# Patient Record
Sex: Female | Born: 1995 | Race: Black or African American | Hispanic: No | Marital: Single | Smoking: Never smoker
Health system: Southern US, Community
[De-identification: ages and names within clinical notes are randomized; demographics above are authoritative.]

## PROBLEM LIST (undated history)

## (undated) ENCOUNTER — Inpatient Hospital Stay (HOSPITAL_COMMUNITY): Payer: Self-pay

## (undated) DIAGNOSIS — T4145XA Adverse effect of unspecified anesthetic, initial encounter: Secondary | ICD-10-CM

## (undated) DIAGNOSIS — J45909 Unspecified asthma, uncomplicated: Secondary | ICD-10-CM

## (undated) DIAGNOSIS — T8859XA Other complications of anesthesia, initial encounter: Secondary | ICD-10-CM

## (undated) HISTORY — PX: INDUCED ABORTION: SHX677

---

## 2017-02-28 ENCOUNTER — Inpatient Hospital Stay (HOSPITAL_COMMUNITY)
Admission: AD | Admit: 2017-02-28 | Discharge: 2017-02-28 | Disposition: A | Discharge status: Home / Self Care | Payer: Self-pay | Source: Ambulatory Visit | Attending: Obstetrics and Gynecology | Admitting: Obstetrics and Gynecology

## 2017-02-28 ENCOUNTER — Inpatient Hospital Stay (HOSPITAL_COMMUNITY): Payer: Self-pay

## 2017-02-28 ENCOUNTER — Encounter (HOSPITAL_COMMUNITY): Payer: Self-pay | Admitting: *Deleted

## 2017-02-28 DIAGNOSIS — O3680X0 Pregnancy with inconclusive fetal viability, not applicable or unspecified: Secondary | ICD-10-CM

## 2017-02-28 DIAGNOSIS — N83202 Unspecified ovarian cyst, left side: Secondary | ICD-10-CM | POA: Insufficient documentation

## 2017-02-28 DIAGNOSIS — O3481 Maternal care for other abnormalities of pelvic organs, first trimester: Secondary | ICD-10-CM | POA: Insufficient documentation

## 2017-02-28 DIAGNOSIS — O26899 Other specified pregnancy related conditions, unspecified trimester: Secondary | ICD-10-CM

## 2017-02-28 DIAGNOSIS — N760 Acute vaginitis: Secondary | ICD-10-CM

## 2017-02-28 DIAGNOSIS — O9989 Other specified diseases and conditions complicating pregnancy, childbirth and the puerperium: Secondary | ICD-10-CM

## 2017-02-28 DIAGNOSIS — R109 Unspecified abdominal pain: Secondary | ICD-10-CM

## 2017-02-28 DIAGNOSIS — B379 Candidiasis, unspecified: Secondary | ICD-10-CM

## 2017-02-28 DIAGNOSIS — O98811 Other maternal infectious and parasitic diseases complicating pregnancy, first trimester: Secondary | ICD-10-CM | POA: Insufficient documentation

## 2017-02-28 DIAGNOSIS — Z3A01 Less than 8 weeks gestation of pregnancy: Secondary | ICD-10-CM | POA: Insufficient documentation

## 2017-02-28 DIAGNOSIS — B373 Candidiasis of vulva and vagina: Secondary | ICD-10-CM | POA: Insufficient documentation

## 2017-02-28 DIAGNOSIS — O23591 Infection of other part of genital tract in pregnancy, first trimester: Secondary | ICD-10-CM | POA: Insufficient documentation

## 2017-02-28 DIAGNOSIS — B9689 Other specified bacterial agents as the cause of diseases classified elsewhere: Secondary | ICD-10-CM

## 2017-02-28 HISTORY — DX: Unspecified asthma, uncomplicated: J45.909

## 2017-02-28 LAB — WET PREP, GENITAL
SPERM: NONE SEEN
Trich, Wet Prep: NONE SEEN

## 2017-02-28 LAB — CBC
HCT: 37.5 % (ref 36.0–46.0)
HEMOGLOBIN: 12.7 g/dL (ref 12.0–15.0)
MCH: 30.5 pg (ref 26.0–34.0)
MCHC: 33.9 g/dL (ref 30.0–36.0)
MCV: 89.9 fL (ref 78.0–100.0)
Platelets: 202 10*3/uL (ref 150–400)
RBC: 4.17 MIL/uL (ref 3.87–5.11)
RDW: 13.2 % (ref 11.5–15.5)
WBC: 8.6 10*3/uL (ref 4.0–10.5)

## 2017-02-28 LAB — URINALYSIS, ROUTINE W REFLEX MICROSCOPIC
BACTERIA UA: NONE SEEN
BILIRUBIN URINE: NEGATIVE
GLUCOSE, UA: NEGATIVE mg/dL
Ketones, ur: NEGATIVE mg/dL
LEUKOCYTES UA: NEGATIVE
NITRITE: NEGATIVE
Protein, ur: NEGATIVE mg/dL
SPECIFIC GRAVITY, URINE: 1.027 (ref 1.005–1.030)
pH: 5 (ref 5.0–8.0)

## 2017-02-28 LAB — POCT PREGNANCY, URINE: Preg Test, Ur: POSITIVE — AB

## 2017-02-28 LAB — HCG, QUANTITATIVE, PREGNANCY: hCG, Beta Chain, Quant, S: 795 m[IU]/mL — ABNORMAL HIGH (ref <5)

## 2017-02-28 MED ORDER — CLINDAMYCIN PHOSPHATE 2 % VA CREA: 1.0000 | TOPICAL_CREAM | Freq: Every day | VAGINAL | 0 refills | Status: DC

## 2017-02-28 MED ORDER — TERCONAZOLE 0.4 % VA CREA: 1.0000 | TOPICAL_CREAM | Freq: Every day | VAGINAL | 0 refills | Status: DC

## 2017-02-28 MED ORDER — METRONIDAZOLE 500 MG PO TABS: 500.0000 mg | ORAL_TABLET | Freq: Two times a day (BID) | ORAL | 0 refills | Status: DC

## 2017-02-28 NOTE — MAU Provider Note (Signed)
History   [id number]   Chief Complaint  Patient presents with  . Abdominal Pain    HPI Carla Hammond is a 21 y.o. female here at approximately 4.5 wks pregnancy by certain LMP.  Reports mid-pelvic pain that started around 1600 today.  Pain is described as sharp.  Denies vaginal bleeding or abnormal vaginal discharge.  No report of UTI symptoms.  Symptoms not associated with nausea, vomiting, diarrhea, or constipation.  No LMP recorded.  OB History  No data available    Past Medical History:  Diagnosis Date  . Asthma     No family history on file.  Social History   Social History  . Marital status: Single    Spouse name: N/A  . Number of children: N/A  . Years of education: N/A   Social History Main Topics  . Smoking status: None  . Smokeless tobacco: None  . Alcohol use None  . Drug use: Unknown  . Sexual activity: Not Asked   Other Topics Concern  . None   Social History Narrative  . None    No Known Allergies  No current facility-administered medications on file prior to encounter.    No current outpatient prescriptions on file prior to encounter.     Review of Systems  Constitutional: Negative for chills and fever.  Gastrointestinal: Negative for abdominal pain, blood in stool, constipation, diarrhea, nausea and vomiting.  Genitourinary: Positive for pelvic pain. Negative for dysuria, flank pain, menstrual problem, vaginal bleeding and vaginal discharge.  All other systems reviewed and are negative.    Physical Exam   Vitals:   02/28/17 2133 02/28/17 2139 02/28/17 2141  BP: (!) 154/76 (!) 150/72 136/76  Pulse: 90 62 70  Resp: 18    Temp: 98.1 F (36.7 C)    TempSrc: Oral    Cuff changed to appropriate size  Physical Exam  Constitutional: She is oriented to person, place, and time. She appears well-developed and well-nourished. No distress.  HENT:  Head: Normocephalic.  Neck: Normal range of motion. Neck supple.  Cardiovascular:  Normal rate, regular rhythm and normal heart sounds.   Respiratory: Effort normal and breath sounds normal. No respiratory distress.  GI: Soft. She exhibits no mass. There is no tenderness. There is no rebound and no guarding.  Genitourinary: Uterus is enlarged. Right adnexum displays no mass, no tenderness and no fullness. Left adnexum displays no mass, no tenderness and no fullness. No bleeding in the vagina.  Musculoskeletal: Normal range of motion.  Neurological: She is alert and oriented to person, place, and time.  Skin: Skin is warm and dry.    MAU Course  Procedures  MDM Results for orders placed or performed during the hospital encounter of 02/28/17 (from the past 24 hour(s))  Urinalysis, Routine w reflex microscopic     Status: Abnormal   Collection Time: 02/28/17  9:10 PM  Result Value Ref Range   Color, Urine YELLOW YELLOW   APPearance HAZY (A) CLEAR   Specific Gravity, Urine 1.027 1.005 - 1.030   pH 5.0 5.0 - 8.0   Glucose, UA NEGATIVE NEGATIVE mg/dL   Hgb urine dipstick SMALL (A) NEGATIVE   Bilirubin Urine NEGATIVE NEGATIVE   Ketones, ur NEGATIVE NEGATIVE mg/dL   Protein, ur NEGATIVE NEGATIVE mg/dL   Nitrite NEGATIVE NEGATIVE   Leukocytes, UA NEGATIVE NEGATIVE   RBC / HPF 0-5 0 - 5 RBC/hpf   WBC, UA 0-5 0 - 5 WBC/hpf   Bacteria, UA NONE SEEN NONE SEEN  Squamous Epithelial / LPF 0-5 (A) NONE SEEN   Mucous PRESENT   Pregnancy, urine POC     Status: Abnormal   Collection Time: 02/28/17  9:34 PM  Result Value Ref Range   Preg Test, Ur POSITIVE (A) NEGATIVE  hCG, quantitative, pregnancy     Status: Abnormal   Collection Time: 02/28/17 10:01 PM  Result Value Ref Range   hCG, Beta Chain, Quant, S 795 (H) <5 mIU/mL  CBC     Status: None   Collection Time: 02/28/17 10:01 PM  Result Value Ref Range   WBC 8.6 4.0 - 10.5 K/uL   RBC 4.17 3.87 - 5.11 MIL/uL   Hemoglobin 12.7 12.0 - 15.0 g/dL   HCT 40.9 81.1 - 91.4 %   MCV 89.9 78.0 - 100.0 fL   MCH 30.5 26.0 - 34.0  pg   MCHC 33.9 30.0 - 36.0 g/dL   RDW 78.2 95.6 - 21.3 %   Platelets 202 150 - 400 K/uL  Wet prep, genital     Status: Abnormal   Collection Time: 02/28/17 10:45 PM  Result Value Ref Range   Yeast Wet Prep HPF POC PRESENT (A) NONE SEEN   Trich, Wet Prep NONE SEEN NONE SEEN   Clue Cells Wet Prep HPF POC PRESENT (A) NONE SEEN   WBC, Wet Prep HPF POC FEW (A) NONE SEEN   Sperm NONE SEEN    Ultrasound: FINDINGS: Intrauterine gestational sac: 3 mm anechoic structure within the endometrium.  Yolk sac:  Not present  Embryo:  Not present  Cardiac Activity: Not present  MSD: 3  mm   5 w   0  d  Subchorionic hemorrhage:  None visualized.  Maternal uterus/adnexae: Small to moderate amount of free fluid. Complex LEFT adnexal hypoechoic 5 x 4.2 x 5 cm structure with lace like echogenicity, increased through transmission, no solid or vascular component.  IMPRESSION: 1. Probable early intrauterine gestational sac, but no yolk sac, fetal pole, or cardiac activity yet visualized. Recommend follow-up quantitative B-HCG levels and follow-up US in 14 days to assess viability. This recommendation follows SRU consensus guidelines: Diagnostic Criteria for Nonviable Pregnancy Early in the First Trimester. Malva Limes Med 2013; 086:5784-69. 2. LEFT adnexal 5 cm complex cyst most consistent with hemorrhagic cyst ; given presence of small to moderate amount of free fluid, this has likely ruptured.  Assessment and Plan  Pelvic Pain in Pregnancy - r/o ectopic Left Ovarian Cyst Bacterial Vaginosis Yeast Infection  Plan: Discharge home Follow-up [facility] in [place] (going back tomorrow) RX Clindamycin Cream RX Monistat OTC Reviewed ectopic precautions and importance of follow-up  Talmage Nap 02/28/2017 11:31 PM

## 2017-02-28 NOTE — MAU Note (Signed)
Lower abdominal pain 1800, denies bleeding

## 2017-02-28 NOTE — Discharge Instructions (Signed)
[facility] 795 02/28/17

## 2017-03-01 LAB — RPR: RPR Ser Ql: NONREACTIVE

## 2017-03-01 LAB — HIV ANTIBODY (ROUTINE TESTING W REFLEX): HIV SCREEN 4TH GENERATION: NONREACTIVE

## 2017-03-02 LAB — GC/CHLAMYDIA PROBE AMP (~~LOC~~) NOT AT ARMC
Chlamydia: NEGATIVE
Neisseria Gonorrhea: NEGATIVE

## 2017-12-19 ENCOUNTER — Encounter (HOSPITAL_COMMUNITY): Payer: Self-pay | Admitting: *Deleted

## 2017-12-19 ENCOUNTER — Inpatient Hospital Stay (HOSPITAL_COMMUNITY)
Admission: AD | Admit: 2017-12-19 | Discharge: 2017-12-20 | Disposition: A | Discharge status: Home / Self Care | Payer: Medicaid Other | Source: Ambulatory Visit | Attending: Obstetrics & Gynecology | Admitting: Obstetrics & Gynecology

## 2017-12-19 DIAGNOSIS — Z3A01 Less than 8 weeks gestation of pregnancy: Secondary | ICD-10-CM | POA: Insufficient documentation

## 2017-12-19 DIAGNOSIS — R109 Unspecified abdominal pain: Secondary | ICD-10-CM | POA: Diagnosis not present

## 2017-12-19 DIAGNOSIS — O26899 Other specified pregnancy related conditions, unspecified trimester: Secondary | ICD-10-CM

## 2017-12-19 DIAGNOSIS — M545 Low back pain: Secondary | ICD-10-CM | POA: Insufficient documentation

## 2017-12-19 DIAGNOSIS — O26891 Other specified pregnancy related conditions, first trimester: Secondary | ICD-10-CM | POA: Diagnosis not present

## 2017-12-19 DIAGNOSIS — O283 Abnormal ultrasonic finding on antenatal screening of mother: Secondary | ICD-10-CM | POA: Diagnosis not present

## 2017-12-19 DIAGNOSIS — O3680X0 Pregnancy with inconclusive fetal viability, not applicable or unspecified: Secondary | ICD-10-CM

## 2017-12-19 LAB — URINALYSIS, ROUTINE W REFLEX MICROSCOPIC
BILIRUBIN URINE: NEGATIVE
Glucose, UA: NEGATIVE mg/dL
Hgb urine dipstick: NEGATIVE
KETONES UR: NEGATIVE mg/dL
Leukocytes, UA: NEGATIVE
Nitrite: NEGATIVE
PROTEIN: NEGATIVE mg/dL
Specific Gravity, Urine: 1.024 (ref 1.005–1.030)
pH: 6 (ref 5.0–8.0)

## 2017-12-19 LAB — POCT PREGNANCY, URINE: PREG TEST UR: POSITIVE — AB

## 2017-12-19 NOTE — MAU Note (Addendum)
Having some pain in lower back and R side since last night. Constipated and had small BM this am. Denies vag bleeding or d/c. LMP 11/16/17. Pos home upt

## 2017-12-20 ENCOUNTER — Encounter (HOSPITAL_COMMUNITY): Payer: Self-pay | Admitting: *Deleted

## 2017-12-20 ENCOUNTER — Inpatient Hospital Stay (HOSPITAL_COMMUNITY): Payer: Medicaid Other

## 2017-12-20 DIAGNOSIS — O283 Abnormal ultrasonic finding on antenatal screening of mother: Secondary | ICD-10-CM

## 2017-12-20 LAB — CBC
HEMATOCRIT: 39.9 % (ref 36.0–46.0)
HEMOGLOBIN: 13.8 g/dL (ref 12.0–15.0)
MCH: 31.5 pg (ref 26.0–34.0)
MCHC: 34.6 g/dL (ref 30.0–36.0)
MCV: 91.1 fL (ref 78.0–100.0)
Platelets: 250 10*3/uL (ref 150–400)
RBC: 4.38 MIL/uL (ref 3.87–5.11)
RDW: 13.1 % (ref 11.5–15.5)
WBC: 9.3 10*3/uL (ref 4.0–10.5)

## 2017-12-20 LAB — WET PREP, GENITAL
Sperm: NONE SEEN
Trich, Wet Prep: NONE SEEN
YEAST WET PREP: NONE SEEN

## 2017-12-20 LAB — HCG, QUANTITATIVE, PREGNANCY: hCG, Beta Chain, Quant, S: 295 m[IU]/mL — ABNORMAL HIGH (ref <5)

## 2017-12-20 LAB — ABO/RH: ABO/RH(D): B POS

## 2017-12-20 NOTE — Progress Notes (Signed)
Written and verbal d/c instructions given and understanding voiced. 

## 2017-12-20 NOTE — Discharge Instructions (Signed)
First Trimester of Pregnancy °The first trimester of pregnancy is from week 1 until the end of week 13 (months 1 through 3). A week after a sperm fertilizes an egg, the egg will implant on the wall of the uterus. This embryo will begin to develop into a baby. Genes from you and your partner will form the baby. The female genes will determine whether the baby will be a boy or a girl. At 6-8 weeks, the eyes and face will be formed, and the heartbeat can be seen on ultrasound. At the end of 12 weeks, all the baby's organs will be formed. °Now that you are pregnant, you will want to do everything you can to have a healthy baby. Two of the most important things are to get good prenatal care and to follow your health care provider's instructions. Prenatal care is all the medical care you receive before the baby's birth. This care will help prevent, find, and treat any problems during the pregnancy and childbirth. °Body changes during your first trimester °Your body goes through many changes during pregnancy. The changes vary from woman to woman. °· You may gain or lose a couple of pounds at first. °· You may feel sick to your stomach (nauseous) and you may throw up (vomit). If the vomiting is uncontrollable, call your health care provider. °· You may tire easily. °· You may develop headaches that can be relieved by medicines. All medicines should be approved by your health care provider. °· You may urinate more often. Painful urination may mean you have a bladder infection. °· You may develop heartburn as a result of your pregnancy. °· You may develop constipation because certain hormones are causing the muscles that push stool through your intestines to slow down. °· You may develop hemorrhoids or swollen veins (varicose veins). °· Your breasts may begin to grow larger and become tender. Your nipples may stick out more, and the tissue that surrounds them (areola) may become darker. °· Your gums may bleed and may be  sensitive to brushing and flossing. °· Dark spots or blotches (chloasma, mask of pregnancy) may develop on your face. This will likely fade after the baby is born. °· Your menstrual periods will stop. °· You may have a loss of appetite. °· You may develop cravings for certain kinds of food. °· You may have changes in your emotions from day to day, such as being excited to be pregnant or being concerned that something may go wrong with the pregnancy and baby. °· You may have more vivid and strange dreams. °· You may have changes in your hair. These can include thickening of your hair, rapid growth, and changes in texture. Some women also have hair loss during or after pregnancy, or hair that feels dry or thin. Your hair will most likely return to normal after your baby is born. ° °What to expect at prenatal visits °During a routine prenatal visit: °· You will be weighed to make sure you and the baby are growing normally. °· Your blood pressure will be taken. °· Your abdomen will be measured to track your baby's growth. °· The fetal heartbeat will be listened to between weeks 10 and 14 of your pregnancy. °· Test results from any previous visits will be discussed. ° °Your health care provider may ask you: °· How you are feeling. °· If you are feeling the baby move. °· If you have had any abnormal symptoms, such as leaking fluid, bleeding, severe headaches,   or abdominal cramping. °· If you are using any tobacco products, including cigarettes, chewing tobacco, and electronic cigarettes. °· If you have any questions. ° °Other tests that may be performed during your first trimester include: °· Blood tests to find your blood type and to check for the presence of any previous infections. The tests will also be used to check for low iron levels (anemia) and protein on red blood cells (Rh antibodies). Depending on your risk factors, or if you previously had diabetes during pregnancy, you may have tests to check for high blood  sugar that affects pregnant women (gestational diabetes). °· Urine tests to check for infections, diabetes, or protein in the urine. °· An ultrasound to confirm the proper growth and development of the baby. °· Fetal screens for spinal cord problems (spina bifida) and Down syndrome. °· HIV (human immunodeficiency virus) testing. Routine prenatal testing includes screening for HIV, unless you choose not to have this test. °· You may need other tests to make sure you and the baby are doing well. ° °Follow these instructions at home: °Medicines °· Follow your health care provider's instructions regarding medicine use. Specific medicines may be either safe or unsafe to take during pregnancy. °· Take a prenatal vitamin that contains at least 600 micrograms (mcg) of folic acid. °· If you develop constipation, try taking a stool softener if your health care provider approves. °Eating and drinking °· Eat a balanced diet that includes fresh fruits and vegetables, whole grains, good sources of protein such as meat, eggs, or tofu, and low-fat dairy. Your health care provider will help you determine the amount of weight gain that is right for you. °· Avoid raw meat and uncooked cheese. These carry germs that can cause birth defects in the baby. °· Eating four or five small meals rather than three large meals a day may help relieve nausea and vomiting. If you start to feel nauseous, eating a few soda crackers can be helpful. Drinking liquids between meals, instead of during meals, also seems to help ease nausea and vomiting. °· Limit foods that are high in fat and processed sugars, such as fried and sweet foods. °· To prevent constipation: °? Eat foods that are high in fiber, such as fresh fruits and vegetables, whole grains, and beans. °? Drink enough fluid to keep your urine clear or pale yellow. °Activity °· Exercise only as directed by your health care provider. Most women can continue their usual exercise routine during  pregnancy. Try to exercise for 30 minutes at least 5 days a week. Exercising will help you: °? Control your weight. °? Stay in shape. °? Be prepared for labor and delivery. °· Experiencing pain or cramping in the lower abdomen or lower back is a good sign that you should stop exercising. Check with your health care provider before continuing with normal exercises. °· Try to avoid standing for long periods of time. Move your legs often if you must stand in one place for a long time. °· Avoid heavy lifting. °· Wear low-heeled shoes and practice good posture. °· You may continue to have sex unless your health care provider tells you not to. °Relieving pain and discomfort °· Wear a good support bra to relieve breast tenderness. °· Take warm sitz baths to soothe any pain or discomfort caused by hemorrhoids. Use hemorrhoid cream if your health care provider approves. °· Rest with your legs elevated if you have leg cramps or low back pain. °· If you develop   varicose veins in your legs, wear support hose. Elevate your feet for 15 minutes, 3-4 times a day. Limit salt in your diet. °Prenatal care °· Schedule your prenatal visits by the twelfth week of pregnancy. They are usually scheduled monthly at first, then more often in the last 2 months before delivery. °· Write down your questions. Take them to your prenatal visits. °· Keep all your prenatal visits as told by your health care provider. This is important. °Safety °· Wear your seat belt at all times when driving. °· Make a list of emergency phone numbers, including numbers for family, friends, the hospital, and police and fire departments. °General instructions °· Ask your health care provider for a referral to a local prenatal education class. Begin classes no later than the beginning of month 6 of your pregnancy. °· Ask for help if you have counseling or nutritional needs during pregnancy. Your health care provider can offer advice or refer you to specialists for help  with various needs. °· Do not use hot tubs, steam rooms, or saunas. °· Do not douche or use tampons or scented sanitary pads. °· Do not cross your legs for long periods of time. °· Avoid cat litter boxes and soil used by cats. These carry germs that can cause birth defects in the baby and possibly loss of the fetus by miscarriage or stillbirth. °· Avoid all smoking, herbs, alcohol, and medicines not prescribed by your health care provider. Chemicals in these products affect the formation and growth of the baby. °· Do not use any products that contain nicotine or tobacco, such as cigarettes and e-cigarettes. If you need help quitting, ask your health care provider. You may receive counseling support and other resources to help you quit. °· Schedule a dentist appointment. At home, brush your teeth with a soft toothbrush and be gentle when you floss. °Contact a health care provider if: °· You have dizziness. °· You have mild pelvic cramps, pelvic pressure, or nagging pain in the abdominal area. °· You have persistent nausea, vomiting, or diarrhea. °· You have a bad smelling vaginal discharge. °· You have pain when you urinate. °· You notice increased swelling in your face, hands, legs, or ankles. °· You are exposed to fifth disease or chickenpox. °· You are exposed to German measles (rubella) and have never had it. °Get help right away if: °· You have a fever. °· You are leaking fluid from your vagina. °· You have spotting or bleeding from your vagina. °· You have severe abdominal cramping or pain. °· You have rapid weight gain or loss. °· You vomit blood or material that looks like coffee grounds. °· You develop a severe headache. °· You have shortness of breath. °· You have any kind of trauma, such as from a fall or a car accident. °Summary °· The first trimester of pregnancy is from week 1 until the end of week 13 (months 1 through 3). °· Your body goes through many changes during pregnancy. The changes vary from  woman to woman. °· You will have routine prenatal visits. During those visits, your health care provider will examine you, discuss any test results you may have, and talk with you about how you are feeling. °This information is not intended to replace advice given to you by your health care provider. Make sure you discuss any questions you have with your health care provider. °Document Released: 06/24/2001 Document Revised: 06/11/2016 Document Reviewed: 06/11/2016 °[employer] Interactive Patient Education © 2018 [employer]  Safe Medications in Pregnancy   Acne: Benzoyl Peroxide Salicylic Acid  Backache/Headache: Tylenol: 2 regular strength every 4 hours OR              2 Extra strength every 6 hours  Colds/Coughs/Allergies: Benadryl (alcohol free) 25 mg every 6 hours as needed Breath right strips Claritin Cepacol throat lozenges Chloraseptic throat spray Cold-Eeze- up to three times per day Cough drops, alcohol free Flonase (by prescription only) Guaifenesin Mucinex Robitussin DM (plain only, alcohol free) Saline nasal spray/drops Sudafed (pseudoephedrine) & Actifed ** use only after [redacted] weeks gestation and if you do not have high blood pressure Tylenol Vicks Vaporub Zinc lozenges Zyrtec   Constipation: Colace Ducolax suppositories Fleet enema Glycerin suppositories Metamucil Milk of magnesia Miralax Senokot Smooth move tea  Diarrhea: Kaopectate Imodium A-D  *NO pepto Bismol  Hemorrhoids: Anusol Anusol HC Preparation H Tucks  Indigestion: Tums Maalox Mylanta Zantac  Pepcid  Insomnia: Benadryl (alcohol free) 25mg  every 6 hours as needed Tylenol PM Unisom, no Gelcaps  Leg Cramps: Tums MagGel  Nausea/Vomiting:  Bonine Dramamine Emetrol Ginger extract Sea bands Meclizine  Nausea medication to take during pregnancy:  Unisom (doxylamine succinate 25 mg tablets) Take one tablet daily at bedtime. If symptoms are not adequately controlled, the dose  can be increased to a maximum recommended dose of two tablets daily (1/2 tablet in the morning, 1/2 tablet mid-afternoon and one at bedtime). Vitamin B6 100mg  tablets. Take one tablet twice a day (up to 200 mg per day).  Skin Rashes: Aveeno products Benadryl cream or 25mg  every 6 hours as needed Calamine Lotion 1% cortisone cream  Yeast infection: Gyne-lotrimin 7 Monistat 7   **If taking multiple medications, please check labels to avoid duplicating the same active ingredients **take medication as directed on the label ** Do not exceed 4000 mg of tylenol in 24 hours **Do not take medications that contain aspirin or ibuprofen

## 2017-12-20 NOTE — MAU Provider Note (Addendum)
History     CSN: [id number]  Arrival date and time: 12/19/17 2202  Chief Complaint  Patient presents with  . Constipation  . Back Pain   HPI Carla Hammond is a 22 y.o. G3P0020 at 3728w6d who presents with lower back pain. She states it started yesterday and rates the pain a 4/10. She has not tried anything for the pain. She denies any vaginal bleeding or discharge. She had a positive UPT today. LMP 11/16/17  OB History    Gravida  3   Para      Term      Preterm      AB  2   Living        SAB      TAB  2   Ectopic      Multiple      Live Births              Past Medical History:  Diagnosis Date  . Asthma     Past Surgical History:  Procedure Laterality Date  . INDUCED ABORTION      No family history on file.  Social History   Tobacco Use  . Smoking status: Never Smoker  . Smokeless tobacco: Never Used  Substance Use Topics  . Alcohol use: Not Currently    Comment: occ when not preg  . Drug use: Never    Allergies: No Known Allergies  Medications Prior to Admission  Medication Sig Dispense Refill Last Dose  . albuterol (ACCUNEB) 1.25 MG/3ML nebulizer solution Take 1 ampule by nebulization every 6 (six) hours as needed for wheezing.   Past Week at Unknown time  . ibuprofen (ADVIL,MOTRIN) 200 MG tablet Take 200 mg by mouth every 6 (six) hours as needed.   Past Month at Unknown time  . Multiple Vitamin (MULTIVITAMIN) capsule Take 1 capsule by mouth daily.   12/19/2017 at Unknown time  . naproxen sodium (ALEVE) 220 MG tablet Take 220 mg by mouth.   Past Week at Unknown time  . traMADol (ULTRAM) 50 MG tablet Take by mouth every 6 (six) hours as needed.   Past Month at Unknown time  . metroNIDAZOLE (FLAGYL) 500 MG tablet Take 1 tablet (500 mg total) by mouth 2 (two) times daily. 14 tablet 0   . terconazole (TERAZOL 7) 0.4 % vaginal cream Place 1 applicator vaginally at bedtime. 45 g 0     Review of Systems  Constitutional: Negative.  Negative for  fatigue and fever.  HENT: Negative.   Respiratory: Negative.  Negative for shortness of breath.   Cardiovascular: Negative.  Negative for chest pain.  Gastrointestinal: Positive for abdominal pain. Negative for constipation, diarrhea, nausea and vomiting.  Genitourinary: Negative.  Negative for dysuria, vaginal bleeding and vaginal discharge.  Musculoskeletal: Positive for back pain.  Neurological: Negative.  Negative for dizziness and headaches.   Physical Exam   Blood pressure 128/74, pulse 88, temperature 98.9 F (37.2 C), resp. rate 18, height 5\' 3"  (1.6 m), weight 214 lb (97.1 kg), last menstrual period 11/16/2017, unknown if currently breastfeeding.  Physical Exam  Nursing note and vitals reviewed. Constitutional: She is oriented to person, place, and time. She appears well-developed and well-nourished. No distress.  HENT:  Head: Normocephalic.  Eyes: Pupils are equal, round, and reactive to light.  Cardiovascular: Normal rate, regular rhythm and normal heart sounds.  Respiratory: Effort normal and breath sounds normal. No respiratory distress.  GI: Soft. Bowel sounds are normal. She exhibits no distension. There is  no tenderness.  Neurological: She is alert and oriented to person, place, and time.  Skin: Skin is warm and dry.  Psychiatric: She has a normal mood and affect. Her behavior is normal. Judgment and thought content normal.    MAU Course  Procedures Results for orders placed or performed during the hospital encounter of 12/19/17 (from the past 24 hour(s))  Urinalysis, Routine w reflex microscopic     Status: None   Collection Time: 12/19/17 10:30 PM  Result Value Ref Range   Color, Urine YELLOW YELLOW   APPearance CLEAR CLEAR   Specific Gravity, Urine 1.024 1.005 - 1.030   pH 6.0 5.0 - 8.0   Glucose, UA NEGATIVE NEGATIVE mg/dL   Hgb urine dipstick NEGATIVE NEGATIVE   Bilirubin Urine NEGATIVE NEGATIVE   Ketones, ur NEGATIVE NEGATIVE mg/dL   Protein, ur  NEGATIVE NEGATIVE mg/dL   Nitrite NEGATIVE NEGATIVE   Leukocytes, UA NEGATIVE NEGATIVE  Pregnancy, urine POC     Status: Abnormal   Collection Time: 12/19/17 11:31 PM  Result Value Ref Range   Preg Test, Ur POSITIVE (A) NEGATIVE  Wet prep, genital     Status: Abnormal   Collection Time: 12/19/17 11:32 PM  Result Value Ref Range   Yeast Wet Prep HPF POC NONE SEEN NONE SEEN   Trich, Wet Prep NONE SEEN NONE SEEN   Clue Cells Wet Prep HPF POC PRESENT (A) NONE SEEN   WBC, Wet Prep HPF POC FEW (A) NONE SEEN   Sperm NONE SEEN   CBC     Status: None   Collection Time: 12/20/17 12:25 AM  Result Value Ref Range   WBC 9.3 4.0 - 10.5 K/uL   RBC 4.38 3.87 - 5.11 MIL/uL   Hemoglobin 13.8 12.0 - 15.0 g/dL   HCT 16.1 09.6 - 04.5 %   MCV 91.1 78.0 - 100.0 fL   MCH 31.5 26.0 - 34.0 pg   MCHC 34.6 30.0 - 36.0 g/dL   RDW 40.9 81.1 - 91.4 %   Platelets 250 150 - 400 K/uL  hCG, quantitative, pregnancy     Status: Abnormal   Collection Time: 12/20/17 12:25 AM  Result Value Ref Range   hCG, Beta Chain, Quant, S 295 (H) <5 mIU/mL  ABO/Rh     Status: None (Preliminary result)   Collection Time: 12/20/17 12:25 AM  Result Value Ref Range   ABO/RH(D)      B POS Performed at [facility], [address] [place], [place] [address]    US Ob Less Than 14 Weeks With Ob Transvaginal  Result Date: 12/20/2017 CLINICAL DATA:  Initial evaluation for low back pain with right lower quadrant pain for 1 day. Early pregnancy. EXAM: OBSTETRIC <14 WK [place] AND TRANSVAGINAL OB US TECHNIQUE: Both transabdominal and transvaginal ultrasound examinations were performed for complete evaluation of the gestation as well as the maternal uterus, adnexal regions, and pelvic cul-de-sac. Transvaginal technique was performed to assess early pregnancy. COMPARISON:  None. FINDINGS: Intrauterine gestational sac: Not visualized. Yolk sac:  Absent. Embryo:  Absent. Cardiac Activity: N/A Heart Rate: N/A  bpm Subchorionic hemorrhage:  None  visualized. Maternal uterus/adnexae: Right ovary unremarkable. 2.9 x 2.6 x 2.4 cm probable corpus luteal cyst noted within the left ovary. Small volume free physiologic fluid within the pelvis. IMPRESSION: 1. Early pregnancy with no discrete IUP or adnexal mass identified. Finding is consistent with a pregnancy of unknown anatomic location. Differential considerations include IUP to early to visualize, recent SAB, or possibly occult  ectopic pregnancy. Close clinical monitoring with serial beta HCGs and close interval follow-up ultrasound recommended as clinically warranted. 2. 2.9 cm left ovarian corpus luteal cyst with small volume free physiologic fluid within the pelvis. Electronically Signed   By: Rise Mu M.D.   On: 12/20/2017 02:12   MDM UA, UPT CBC, HCG, ABO/Rh Wet prep and gc/chlamydia US OB Comp Less 14 weeks with Transvaginal   Assessment and Plan   1. Pregnancy of unknown anatomic location   2. Abdominal pain affecting pregnancy   3. [redacted] weeks gestation of pregnancy    -Discharge home in stable condition -Strict ectopic precautions discussed -Patient advised to follow-up with [facility] on Tuesday morning at 0830 for repeat HCG -Patient may return to MAU as needed or if her condition were to change or worsen   Rolm Bookbinder CNM 12/20/2017, 1:48 AM

## 2017-12-21 LAB — GC/CHLAMYDIA PROBE AMP (~~LOC~~) NOT AT ARMC
Chlamydia: NEGATIVE
Neisseria Gonorrhea: NEGATIVE

## 2017-12-22 ENCOUNTER — Other Ambulatory Visit (INDEPENDENT_AMBULATORY_CARE_PROVIDER_SITE_OTHER): Payer: Self-pay | Admitting: General Practice

## 2017-12-22 DIAGNOSIS — O283 Abnormal ultrasonic finding on antenatal screening of mother: Secondary | ICD-10-CM

## 2017-12-22 DIAGNOSIS — O3680X0 Pregnancy with inconclusive fetal viability, not applicable or unspecified: Secondary | ICD-10-CM

## 2017-12-22 LAB — HCG, QUANTITATIVE, PREGNANCY: HCG, BETA CHAIN, QUANT, S: 1048 m[IU]/mL — AB (ref <5)

## 2017-12-22 NOTE — Progress Notes (Signed)
I have reviewed the chart and agree with nursing staff's documentation of this patient's encounter.  Vonzella NippleJulie Wenzel, PA-C 12/22/2017 10:55 AM

## 2017-12-22 NOTE — Progress Notes (Signed)
Patient presents to office today for stat bhcg. Patient denies pain or bleeding. Discussed with patient we are monitoring her bhcg levels today and asked she wait in lobby for results/updated plan of care. Patient verbalized understanding & had no questions at this time.   Reviewed results with Vonzella NippleJulie Wenzel who finds appropriate rise in bhcg levels, patient should have follow up u/s in 7-10 days. Scheduled for 6/19 @ 10am.  Informed patient of results & u/s appt. Ectopic precautions reviewed. Patient verbalized understanding & had no questions.

## 2017-12-30 ENCOUNTER — Ambulatory Visit (INDEPENDENT_AMBULATORY_CARE_PROVIDER_SITE_OTHER): Payer: Self-pay | Admitting: *Deleted

## 2017-12-30 ENCOUNTER — Ambulatory Visit (HOSPITAL_COMMUNITY)
Admission: RE | Admit: 2017-12-30 | Discharge: 2017-12-30 | Disposition: A | Discharge status: Home / Self Care | Payer: Self-pay | Source: Ambulatory Visit | Attending: Medical | Admitting: Medical

## 2017-12-30 DIAGNOSIS — Z3A01 Less than 8 weeks gestation of pregnancy: Secondary | ICD-10-CM | POA: Insufficient documentation

## 2017-12-30 DIAGNOSIS — O368390 Maternal care for abnormalities of the fetal heart rate or rhythm, unspecified trimester, not applicable or unspecified: Secondary | ICD-10-CM

## 2017-12-30 DIAGNOSIS — O3680X0 Pregnancy with inconclusive fetal viability, not applicable or unspecified: Secondary | ICD-10-CM

## 2017-12-30 DIAGNOSIS — Z712 Person consulting for explanation of examination or test findings: Secondary | ICD-10-CM

## 2017-12-30 DIAGNOSIS — Z3490 Encounter for supervision of normal pregnancy, unspecified, unspecified trimester: Secondary | ICD-10-CM

## 2017-12-30 DIAGNOSIS — O283 Abnormal ultrasonic finding on antenatal screening of mother: Secondary | ICD-10-CM | POA: Insufficient documentation

## 2017-12-30 NOTE — Progress Notes (Signed)
US results reviewed by Dr. Marice Potterove. Pt informed of results including low normal fetal heart rate. Pt agrees to repeat US in 2 weeks to evaluate pregnancy progression. Appt scheduled on 7/3 @ 1100.

## 2017-12-31 NOTE — Progress Notes (Signed)
I have reviewed this chart and agree with the RN/CMA assessment and management.    Nelton Amsden C Gwenivere Hiraldo, MD, FACOG Attending Physician, [facility]  

## 2018-01-01 ENCOUNTER — Inpatient Hospital Stay (HOSPITAL_COMMUNITY)
Admission: AD | Admit: 2018-01-01 | Discharge: 2018-01-02 | Disposition: A | Discharge status: Home / Self Care | Payer: Medicaid Other | Source: Ambulatory Visit | Attending: Obstetrics and Gynecology | Admitting: Obstetrics and Gynecology

## 2018-01-01 DIAGNOSIS — O209 Hemorrhage in early pregnancy, unspecified: Secondary | ICD-10-CM | POA: Insufficient documentation

## 2018-01-01 DIAGNOSIS — Z3A01 Less than 8 weeks gestation of pregnancy: Secondary | ICD-10-CM | POA: Insufficient documentation

## 2018-01-01 DIAGNOSIS — J45909 Unspecified asthma, uncomplicated: Secondary | ICD-10-CM | POA: Insufficient documentation

## 2018-01-01 DIAGNOSIS — Z9889 Other specified postprocedural states: Secondary | ICD-10-CM | POA: Insufficient documentation

## 2018-01-01 DIAGNOSIS — Z79899 Other long term (current) drug therapy: Secondary | ICD-10-CM | POA: Insufficient documentation

## 2018-01-01 DIAGNOSIS — O99511 Diseases of the respiratory system complicating pregnancy, first trimester: Secondary | ICD-10-CM | POA: Insufficient documentation

## 2018-01-01 NOTE — MAU Note (Signed)
Had u/s Weds and everything was fine. Went to BR and saw dark red/brown on tissue. No pain.

## 2018-01-01 NOTE — MAU Note (Addendum)
Thressa ShellerHeather Hogan CNM in Triage to see pt. Bedside u/s done and pt reassured. Will d/c home from Triage

## 2018-01-01 NOTE — MAU Provider Note (Addendum)
History     CSN: [id number]  Arrival date and time: 01/01/18 2335   First Provider Initiated Contact with Patient 01/01/18 2355      Chief Complaint  Patient presents with  . Vaginal Bleeding   Carla Hammond is a 22 y.o. G3P0020 at [redacted]w[redacted]d who presents today with brown spotting. She has a confirmed IUP on [place] from 12/30/17. She states that she had one episode of brown spotting just prior to arrival. She denies any pain/   Vaginal Bleeding  The patient's primary symptoms include vaginal bleeding. This is a new problem. The current episode started today. The problem has been resolved. The patient is experiencing no pain. She is pregnant. The vaginal discharge was brown. The vaginal bleeding is spotting. Nothing aggravates the symptoms. She has tried nothing for the symptoms. Sexual activity: No intercourse in the last 24 hours.     Past Medical History:  Diagnosis Date  . Asthma     Past Surgical History:  Procedure Laterality Date  . INDUCED ABORTION      No family history on file.  Social History   Tobacco Use  . Smoking status: Never Smoker  . Smokeless tobacco: Never Used  Substance Use Topics  . Alcohol use: Not Currently    Comment: occ when not preg  . Drug use: Never    Allergies: No Known Allergies  Medications Prior to Admission  Medication Sig Dispense Refill Last Dose  . albuterol (ACCUNEB) 1.25 MG/3ML nebulizer solution Take 1 ampule by nebulization every 6 (six) hours as needed for wheezing.   Past Week at Unknown time  . Multiple Vitamin (MULTIVITAMIN) capsule Take 1 capsule by mouth daily.   12/19/2017 at Unknown time    Review of Systems  Genitourinary: Positive for vaginal bleeding.   Physical Exam   Blood pressure (!) 142/68, pulse 88, temperature 98.6 F (37 C), resp. rate 16, height 5\' 3"  (1.6 m), weight 216 lb (98 kg), last menstrual period 11/16/2017, unknown if currently breastfeeding.  Physical Exam  Nursing note and vitals  reviewed. Constitutional: She is oriented to person, place, and time. She appears well-developed and well-nourished. No distress.  HENT:  Head: Normocephalic.  Cardiovascular: Normal rate.  Respiratory: Effort normal.  GI: Soft. There is no tenderness. There is no rebound.  Neurological: She is alert and oriented to person, place, and time.  Skin: Skin is warm and dry.  Psychiatric: She has a normal mood and affect.    Bedside US done. Fetal pole [redacted]w[redacted]d (consistent prior US)  Pt informed that the ultrasound is considered a limited OB ultrasound and is not intended to be a complete ultrasound exam.  Patient also informed that the ultrasound is not being completed with the intent of assessing for fetal or placental anomalies or any pelvic abnormalities.  Explained that the purpose of today's ultrasound is to assess for  viability.  Patient acknowledges the purpose of the exam and the limitations of the study.      MAU Course  Procedures  MDM   Assessment and Plan   1. Vaginal bleeding in pregnancy, first trimester   2. [redacted] weeks gestation of pregnancy    DC home Comfort measures reviewed  1st Trimester precautions  Bleeding precautions RX: phenergan PRN #30 with 2 RF  Return to MAU as needed FU with OB as planned  Follow-up Information    [facility] Follow up.   Specialty:  Obstetrics and Gynecology Contact information: [address]  [facility] [place] [id number] [phone]           Thressa ShellerHeather Hogan 01/01/2018, 11:56 PM

## 2018-01-02 DIAGNOSIS — J45909 Unspecified asthma, uncomplicated: Secondary | ICD-10-CM | POA: Diagnosis not present

## 2018-01-02 DIAGNOSIS — Z9889 Other specified postprocedural states: Secondary | ICD-10-CM | POA: Diagnosis not present

## 2018-01-02 DIAGNOSIS — O99511 Diseases of the respiratory system complicating pregnancy, first trimester: Secondary | ICD-10-CM | POA: Diagnosis not present

## 2018-01-02 DIAGNOSIS — Z79899 Other long term (current) drug therapy: Secondary | ICD-10-CM | POA: Diagnosis not present

## 2018-01-02 DIAGNOSIS — O209 Hemorrhage in early pregnancy, unspecified: Secondary | ICD-10-CM | POA: Diagnosis not present

## 2018-01-02 DIAGNOSIS — Z3A01 Less than 8 weeks gestation of pregnancy: Secondary | ICD-10-CM | POA: Diagnosis not present

## 2018-01-02 MED ORDER — PROMETHAZINE HCL 25 MG PO TABS: 12.5000 mg | ORAL_TABLET | Freq: Four times a day (QID) | ORAL | 2 refills | Status: DC | PRN

## 2018-01-02 NOTE — Progress Notes (Signed)
Pt given script for phenergan per pt request. Written and verbal d/c instructions given and understanding voiced

## 2018-01-02 NOTE — Discharge Instructions (Signed)
Pelvic Rest °Pelvic rest may be recommended if: °· Your placenta is partially or completely covering the opening of your cervix (placenta previa). °· There is bleeding between the wall of the uterus and the amniotic sac in the first trimester of pregnancy (subchorionic hemorrhage). °· You went into labor too early (preterm labor). ° °Based on your overall health and the health of your baby, your health care provider will decide if pelvic rest is right for you. °How do I rest my pelvis? °For as long as told by your health care provider: °· Do not have sex, sexual stimulation, or an orgasm. °· Do not use tampons. Do not douche. Do not put anything in your vagina. °· Do not lift anything that is heavier than 10 lb (4.5 kg). °· Avoid activities that take a lot of effort (are strenuous). °· Avoid any activity in which your pelvic muscles could become strained. ° °When should I seek medical care? °Seek medical care if you have: °· Cramping pain in your lower abdomen. °· Vaginal discharge. °· A low, dull backache. °· Regular contractions. °· Uterine tightening. ° °When should I seek immediate medical care? °Seek immediate medical care if: °· You have vaginal bleeding and you are pregnant. ° °This information is not intended to replace advice given to you by your health care provider. Make sure you discuss any questions you have with your health care provider. °Document Released: 10/25/2010 Document Revised: 12/06/2015 Document Reviewed: 01/01/2015 °Elsevier Interactive Patient Education © 2018 [employer]. ° °

## 2018-01-13 ENCOUNTER — Ambulatory Visit (HOSPITAL_COMMUNITY)
Admission: RE | Admit: 2018-01-13 | Discharge: 2018-01-13 | Disposition: A | Discharge status: Home / Self Care | Payer: Medicaid Other | Source: Ambulatory Visit | Attending: Obstetrics & Gynecology | Admitting: Obstetrics & Gynecology

## 2018-01-13 ENCOUNTER — Ambulatory Visit (INDEPENDENT_AMBULATORY_CARE_PROVIDER_SITE_OTHER): Payer: Self-pay | Admitting: General Practice

## 2018-01-13 DIAGNOSIS — Z3490 Encounter for supervision of normal pregnancy, unspecified, unspecified trimester: Secondary | ICD-10-CM | POA: Diagnosis present

## 2018-01-13 DIAGNOSIS — Z3A08 8 weeks gestation of pregnancy: Secondary | ICD-10-CM | POA: Insufficient documentation

## 2018-01-13 DIAGNOSIS — O368390 Maternal care for abnormalities of the fetal heart rate or rhythm, unspecified trimester, not applicable or unspecified: Secondary | ICD-10-CM

## 2018-01-13 DIAGNOSIS — Z3491 Encounter for supervision of normal pregnancy, unspecified, first trimester: Secondary | ICD-10-CM | POA: Diagnosis not present

## 2018-01-13 DIAGNOSIS — Z712 Person consulting for explanation of examination or test findings: Secondary | ICD-10-CM

## 2018-01-13 NOTE — Progress Notes (Signed)
Patient presents to office today for viability ultrasound results. Reviewed ultrasound with Dr Vergie LivingPickens who finds living IUP- patient should begin prenatal care.   Informed patient of results, provided pictures, & recommended she start OB care. Patient verbalized understanding and had no questions.

## 2018-01-15 NOTE — Progress Notes (Signed)
I have reviewed the chart and agree with nursing staff's documentation of this patient's encounter.  Yuliana Vandrunen, MD 01/15/2018 9:23 AM    

## 2018-01-20 ENCOUNTER — Other Ambulatory Visit: Payer: Self-pay

## 2018-01-20 ENCOUNTER — Emergency Department (HOSPITAL_COMMUNITY)
Admission: EM | Admit: 2018-01-20 | Discharge: 2018-01-20 | Disposition: A | Discharge status: Home / Self Care | Payer: No Typology Code available for payment source | Attending: Emergency Medicine | Admitting: Emergency Medicine

## 2018-01-20 ENCOUNTER — Emergency Department (HOSPITAL_COMMUNITY): Payer: No Typology Code available for payment source

## 2018-01-20 ENCOUNTER — Encounter (HOSPITAL_COMMUNITY): Payer: Self-pay

## 2018-01-20 DIAGNOSIS — S161XXA Strain of muscle, fascia and tendon at neck level, initial encounter: Secondary | ICD-10-CM

## 2018-01-20 DIAGNOSIS — Z3A09 9 weeks gestation of pregnancy: Secondary | ICD-10-CM | POA: Insufficient documentation

## 2018-01-20 DIAGNOSIS — Z331 Pregnant state, incidental: Secondary | ICD-10-CM

## 2018-01-20 DIAGNOSIS — O9989 Other specified diseases and conditions complicating pregnancy, childbirth and the puerperium: Secondary | ICD-10-CM | POA: Diagnosis not present

## 2018-01-20 DIAGNOSIS — Y998 Other external cause status: Secondary | ICD-10-CM | POA: Diagnosis not present

## 2018-01-20 DIAGNOSIS — S90415A Abrasion, left lesser toe(s), initial encounter: Secondary | ICD-10-CM | POA: Diagnosis not present

## 2018-01-20 DIAGNOSIS — Y92410 Unspecified street and highway as the place of occurrence of the external cause: Secondary | ICD-10-CM | POA: Insufficient documentation

## 2018-01-20 DIAGNOSIS — Y9389 Activity, other specified: Secondary | ICD-10-CM | POA: Insufficient documentation

## 2018-01-20 DIAGNOSIS — R51 Headache: Secondary | ICD-10-CM | POA: Insufficient documentation

## 2018-01-20 DIAGNOSIS — J45909 Unspecified asthma, uncomplicated: Secondary | ICD-10-CM | POA: Insufficient documentation

## 2018-01-20 MED ORDER — ACETAMINOPHEN 325 MG PO TABS
650.0000 mg | ORAL_TABLET | Freq: Once | ORAL | Status: AC
  Administered 2018-01-20: 650 mg via ORAL
  Filled 2018-01-20: qty 2
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
## 2018-01-20 NOTE — Discharge Instructions (Addendum)
Recheck with your OB/GYN, return to emergency room for any worsening or concerning symptoms. Home to rest, apply warm compresses for 20 minutes at a time to sore neck.  You can take Tylenol for the pain as directed as needed.  Do not take NSAID medications such as Aleve, Motrin, Tylenol as these are contraindicated in pregnancy.

## 2018-01-20 NOTE — ED Triage Notes (Signed)
Pt is alert and oriented x 4 and is verbally responsive. Pt c/o mild HA . + PERRLA, pt has an abrasion to left foot and reports a throbbing pain. Pt  is [redacted] weeks gestation.
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
## 2018-01-25 ENCOUNTER — Inpatient Hospital Stay (HOSPITAL_COMMUNITY)
Admission: AD | Admit: 2018-01-25 | Discharge: 2018-01-25 | Disposition: A | Discharge status: Home / Self Care | Payer: No Typology Code available for payment source | Source: Ambulatory Visit | Attending: Obstetrics & Gynecology | Admitting: Obstetrics & Gynecology

## 2018-01-25 ENCOUNTER — Other Ambulatory Visit: Payer: Self-pay

## 2018-01-25 ENCOUNTER — Encounter (HOSPITAL_COMMUNITY): Payer: Self-pay | Admitting: *Deleted

## 2018-01-25 DIAGNOSIS — J45909 Unspecified asthma, uncomplicated: Secondary | ICD-10-CM | POA: Insufficient documentation

## 2018-01-25 DIAGNOSIS — Z79899 Other long term (current) drug therapy: Secondary | ICD-10-CM | POA: Insufficient documentation

## 2018-01-25 DIAGNOSIS — O99511 Diseases of the respiratory system complicating pregnancy, first trimester: Secondary | ICD-10-CM | POA: Insufficient documentation

## 2018-01-25 DIAGNOSIS — O9989 Other specified diseases and conditions complicating pregnancy, childbirth and the puerperium: Secondary | ICD-10-CM

## 2018-01-25 DIAGNOSIS — Z3A10 10 weeks gestation of pregnancy: Secondary | ICD-10-CM | POA: Insufficient documentation

## 2018-01-25 DIAGNOSIS — O26891 Other specified pregnancy related conditions, first trimester: Secondary | ICD-10-CM | POA: Insufficient documentation

## 2018-01-25 DIAGNOSIS — R42 Dizziness and giddiness: Secondary | ICD-10-CM | POA: Diagnosis not present

## 2018-01-25 DIAGNOSIS — R109 Unspecified abdominal pain: Secondary | ICD-10-CM | POA: Diagnosis not present

## 2018-01-25 DIAGNOSIS — M549 Dorsalgia, unspecified: Secondary | ICD-10-CM

## 2018-01-25 DIAGNOSIS — Z3491 Encounter for supervision of normal pregnancy, unspecified, first trimester: Secondary | ICD-10-CM

## 2018-01-25 LAB — URINALYSIS, ROUTINE W REFLEX MICROSCOPIC
Bilirubin Urine: NEGATIVE
Glucose, UA: NEGATIVE mg/dL
Hgb urine dipstick: NEGATIVE
Ketones, ur: 80 mg/dL — AB
NITRITE: NEGATIVE
PROTEIN: 100 mg/dL — AB
SPECIFIC GRAVITY, URINE: 1.03 (ref 1.005–1.030)
pH: 5 (ref 5.0–8.0)

## 2018-01-25 MED ORDER — CYCLOBENZAPRINE HCL 10 MG PO TABS: 10.0000 mg | ORAL_TABLET | Freq: Two times a day (BID) | ORAL | 0 refills | Status: DC | PRN
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
## 2018-01-25 NOTE — MAU Note (Signed)
Pt presents with c/o lower back pain and bilateral lower abdominal pain that began Wednesday night.  Denies VB.  Pt also reports dizziness

## 2018-02-17 ENCOUNTER — Encounter (HOSPITAL_COMMUNITY): Payer: Self-pay

## 2018-02-17 ENCOUNTER — Other Ambulatory Visit: Payer: Self-pay

## 2018-02-17 ENCOUNTER — Inpatient Hospital Stay (HOSPITAL_COMMUNITY)
Admission: AD | Admit: 2018-02-17 | Discharge: 2018-02-17 | Disposition: A | Discharge status: Home / Self Care | Payer: Medicaid Other | Source: Ambulatory Visit | Attending: Obstetrics & Gynecology | Admitting: Obstetrics & Gynecology

## 2018-02-17 DIAGNOSIS — R112 Nausea with vomiting, unspecified: Secondary | ICD-10-CM | POA: Diagnosis present

## 2018-02-17 DIAGNOSIS — Z3A13 13 weeks gestation of pregnancy: Secondary | ICD-10-CM | POA: Diagnosis not present

## 2018-02-17 DIAGNOSIS — O99511 Diseases of the respiratory system complicating pregnancy, first trimester: Secondary | ICD-10-CM | POA: Insufficient documentation

## 2018-02-17 DIAGNOSIS — O219 Vomiting of pregnancy, unspecified: Secondary | ICD-10-CM | POA: Insufficient documentation

## 2018-02-17 DIAGNOSIS — Z79899 Other long term (current) drug therapy: Secondary | ICD-10-CM | POA: Diagnosis not present

## 2018-02-17 DIAGNOSIS — J45909 Unspecified asthma, uncomplicated: Secondary | ICD-10-CM | POA: Insufficient documentation

## 2018-02-17 LAB — COMPREHENSIVE METABOLIC PANEL
ALT: 29 U/L (ref 0–44)
ANION GAP: 12 (ref 5–15)
AST: 25 U/L (ref 15–41)
Albumin: 3.8 g/dL (ref 3.5–5.0)
Alkaline Phosphatase: 40 U/L (ref 38–126)
BILIRUBIN TOTAL: 0.5 mg/dL (ref 0.3–1.2)
BUN: 12 mg/dL (ref 6–20)
CHLORIDE: 105 mmol/L (ref 98–111)
CO2: 17 mmol/L — AB (ref 22–32)
Calcium: 9.2 mg/dL (ref 8.9–10.3)
Creatinine, Ser: 0.66 mg/dL (ref 0.44–1.00)
GFR calc Af Amer: >60 mL/min (ref >60)
GFR calc non Af Amer: >60 mL/min (ref >60)
GLUCOSE: 76 mg/dL (ref 70–99)
Potassium: 4.1 mmol/L (ref 3.5–5.1)
SODIUM: 134 mmol/L — AB (ref 135–145)
TOTAL PROTEIN: 7.5 g/dL (ref 6.5–8.1)

## 2018-02-17 LAB — CBC
HCT: 37.4 % (ref 36.0–46.0)
Hemoglobin: 13.1 g/dL (ref 12.0–15.0)
MCH: 31.4 pg (ref 26.0–34.0)
MCHC: 35 g/dL (ref 30.0–36.0)
MCV: 89.7 fL (ref 78.0–100.0)
Platelets: 254 10*3/uL (ref 150–400)
RBC: 4.17 MIL/uL (ref 3.87–5.11)
RDW: 12.8 % (ref 11.5–15.5)
WBC: 11.2 10*3/uL — ABNORMAL HIGH (ref 4.0–10.5)

## 2018-02-17 LAB — URINALYSIS, ROUTINE W REFLEX MICROSCOPIC
GLUCOSE, UA: NEGATIVE mg/dL
KETONES UR: 80 mg/dL — AB
NITRITE: NEGATIVE
PROTEIN: 100 mg/dL — AB
Specific Gravity, Urine: 1.03 (ref 1.005–1.030)
pH: 5 (ref 5.0–8.0)

## 2018-02-17 MED ORDER — M.V.I. ADULT IV INJ
Freq: Once | INTRAVENOUS | Status: AC
  Administered 2018-02-17: 18:00:00 via INTRAVENOUS
  Filled 2018-02-17: qty 10

## 2018-02-17 MED ORDER — FAMOTIDINE IN NACL 20-0.9 MG/50ML-% IV SOLN
20.0000 mg | Freq: Once | INTRAVENOUS | Status: AC
  Administered 2018-02-17: 20 mg via INTRAVENOUS
  Filled 2018-02-17: qty 50

## 2018-02-17 MED ORDER — SODIUM CHLORIDE 0.9 % IV SOLN
8.0000 mg | Freq: Once | INTRAVENOUS | Status: AC
  Administered 2018-02-17: 8 mg via INTRAVENOUS
  Filled 2018-02-17: qty 4

## 2018-02-17 MED ORDER — LACTATED RINGERS IV BOLUS
1000.0000 mL | Freq: Once | INTRAVENOUS | Status: AC
  Administered 2018-02-17: 1000 mL via INTRAVENOUS

## 2018-02-17 MED ORDER — ONDANSETRON 4 MG PO TBDP: 4.0000 mg | ORAL_TABLET | Freq: Three times a day (TID) | ORAL | 1 refills | Status: DC | PRN

## 2018-02-17 NOTE — MAU Provider Note (Signed)
Chief Complaint: Emesis   First Provider Initiated Contact with Patient 02/17/18 1518     SUBJECTIVE HPI: Carla Hammond is a 22 y.o. G3P0020 at 7045w2d by LMP who presents to maternity admissions reporting nausea and vomiting. She reports nausea and vomiting has been occurring for over 1 month but has gotten worse today while she was at work. She reports prior to going to work when she felt nauseous, took Phenergan prescribed and vomited it back up after she ate breakfast. She reports continued emesis while she was at work. She reports vomiting 5-6 times today. She denies abdominal pain or cramping. She denies vaginal bleeding, vaginal itching/burning, urinary symptoms, h/a, dizziness, or fever/chills.     Past Medical History:  Diagnosis Date  . Asthma    Past Surgical History:  Procedure Laterality Date  . INDUCED ABORTION     Social History   Socioeconomic History  . Marital status: Single    Spouse name: Not on file  . Number of children: Not on file  . Years of education: Not on file  . Highest education level: Not on file  Occupational History  . Not on file  Social Needs  . Financial resource strain: Not on file  . Food insecurity:    Worry: Not on file    Inability: Not on file  . Transportation needs:    Medical: Not on file    Non-medical: Not on file  Tobacco Use  . Smoking status: Never Smoker  . Smokeless tobacco: Never Used  Substance and Sexual Activity  . Alcohol use: Not Currently    Comment: occ when not preg  . Drug use: Never  . Sexual activity: Yes  Lifestyle  . Physical activity:    Days per week: Not on file    Minutes per session: Not on file  . Stress: Not on file  Relationships  . Social connections:    Talks on phone: Not on file    Gets together: Not on file    Attends religious service: Not on file    Active member of club or organization: Not on file    Attends meetings of clubs or organizations: Not on file    Relationship status:  Not on file  . Intimate partner violence:    Fear of current or ex partner: Not on file    Emotionally abused: Not on file    Physically abused: Not on file    Forced sexual activity: Not on file  Other Topics Concern  . Not on file  Social History Narrative  . Not on file   No current facility-administered medications on file prior to encounter.    Current Outpatient Medications on File Prior to Encounter  Medication Sig Dispense Refill  . albuterol (ACCUNEB) 1.25 MG/3ML nebulizer solution Take 1 ampule by nebulization every 6 (six) hours as needed for wheezing.    . cyclobenzaprine (FLEXERIL) 10 MG tablet Take 1 tablet (10 mg total) by mouth 2 (two) times daily as needed for muscle spasms. 20 tablet 0  . Multiple Vitamin (MULTIVITAMIN) capsule Take 1 capsule by mouth daily.    . promethazine (PHENERGAN) 25 MG tablet Take 0.5-1 tablets (12.5-25 mg total) by mouth every 6 (six) hours as needed. 30 tablet 2   No Known Allergies  ROS:  Review of Systems  Constitutional: Negative.   Respiratory: Negative.   Cardiovascular: Negative.   Gastrointestinal: Positive for nausea and vomiting. Negative for abdominal pain, constipation and diarrhea.  Genitourinary: Negative.  Neurological: Negative.    I have reviewed patient's Past Medical Hx, Surgical Hx, Family Hx, Social Hx, medications and allergies.   Physical Exam   Patient Vitals for the past 24 hrs:  BP Temp Temp src Pulse Resp Height Weight  02/17/18 1750 (!) 126/57 - - 64 18 - -  02/17/18 1455 137/72 - - 74 - - -  02/17/18 1454 - 98.5 F (36.9 C) Oral - 18 5\' 3"  (1.6 m) 197 lb 1.3 oz (89.4 kg)   Constitutional: Well-developed, well-nourished female in no acute distress.  Mucous membranes noted to be dry  Cardiovascular: normal rate Respiratory: normal effort GI: Abd soft, non-tender. Pos BS x 4 MS: Extremities nontender, no edema, normal ROM Neurologic: Alert and oriented x 4.  GU: Neg CVAT. PELVIC EXAM:  deferred  FHT 158 by doppler  LAB RESULTS Results for orders placed or performed during the hospital encounter of 02/17/18 (from the past 24 hour(s))  Urinalysis, Routine w reflex microscopic     Status: Abnormal   Collection Time: 02/17/18  2:42 PM  Result Value Ref Range   Color, Urine AMBER (A) YELLOW   APPearance CLOUDY (A) CLEAR   Specific Gravity, Urine 1.030 1.005 - 1.030   pH 5.0 5.0 - 8.0   Glucose, UA NEGATIVE NEGATIVE mg/dL   Hgb urine dipstick SMALL (A) NEGATIVE   Bilirubin Urine SMALL (A) NEGATIVE   Ketones, ur 80 (A) NEGATIVE mg/dL   Protein, ur 161 (A) NEGATIVE mg/dL   Nitrite NEGATIVE NEGATIVE   Leukocytes, UA TRACE (A) NEGATIVE   RBC / HPF 21-50 0 - 5 RBC/hpf   WBC, UA 11-20 0 - 5 WBC/hpf   Bacteria, UA RARE (A) NONE SEEN   Squamous Epithelial / LPF 21-50 0 - 5   Mucus PRESENT   CBC     Status: Abnormal   Collection Time: 02/17/18  4:34 PM  Result Value Ref Range   WBC 11.2 (H) 4.0 - 10.5 K/uL   RBC 4.17 3.87 - 5.11 MIL/uL   Hemoglobin 13.1 12.0 - 15.0 g/dL   HCT 09.6 04.5 - 40.9 %   MCV 89.7 78.0 - 100.0 fL   MCH 31.4 26.0 - 34.0 pg   MCHC 35.0 30.0 - 36.0 g/dL   RDW 81.1 91.4 - 78.2 %   Platelets 254 150 - 400 K/uL  Comprehensive metabolic panel     Status: Abnormal   Collection Time: 02/17/18  5:22 PM  Result Value Ref Range   Sodium 134 (L) 135 - 145 mmol/L   Potassium 4.1 3.5 - 5.1 mmol/L   Chloride 105 98 - 111 mmol/L   CO2 17 (L) 22 - 32 mmol/L   Glucose, Bld 76 70 - 99 mg/dL   BUN 12 6 - 20 mg/dL   Creatinine, Ser 9.56 0.44 - 1.00 mg/dL   Calcium 9.2 8.9 - 21.3 mg/dL   Total Protein 7.5 6.5 - 8.1 g/dL   Albumin 3.8 3.5 - 5.0 g/dL   AST 25 15 - 41 U/L   ALT 29 0 - 44 U/L   Alkaline Phosphatase 40 38 - 126 U/L   Total Bilirubin 0.5 0.3 - 1.2 mg/dL   GFR calc non Af Amer >60 >60 mL/min   GFR calc Af Amer >60 >60 mL/min   Anion gap 12 5 - 15    --/--/B POS Performed at [facility], [address], [place], [place] [address]   (06/09 0025)  MAU Management/MDM: Orders Placed This Encounter  Procedures  . Urinalysis, Routine w reflex microscopic  . CBC  . Comprehensive metabolic panel  . Insert peripheral IV  . Discharge patient Discharge disposition: 01-Home or Self Care; Discharge patient date: 02/17/2018   UA- noted to have 80 ketones and 100 protein present  CBC and CMP- WNL   Meds ordered this encounter  Medications  . lactated ringers bolus 1,000 mL  . famotidine (PEPCID) IVPB 20 mg premix  . ondansetron (ZOFRAN) 8 mg in sodium chloride 0.9 % 50 mL IVPB  . multivitamins adult (MVI -12) 10 mL in dextrose 5% lactated ringers 1,000 mL infusion  . ondansetron (ZOFRAN ODT) 4 MG disintegrating tablet    Sig: Take 1 tablet (4 mg total) by mouth every 8 (eight) hours as needed for nausea or vomiting.    Dispense:  30 tablet    Refill:  1    Order Specific Question:   Supervising Provider    Answer:   Adam Phenix [[id number]]   Treatments in MAU included LR fluid bolus, pepcid 20mg  IV, zofran 8mg  IV, banana bag. Patient able to tolerate crackers and gingerale prior to discharge home. Rx for Zofran given to patient as requested. Work note given. Pt discharged. Pt stable at time of discharge.   ASSESSMENT 1. Nausea and vomiting during pregnancy prior to [redacted] weeks gestation   2. [redacted] weeks gestation of pregnancy     PLAN Discharge home Follow up as scheduled for prenatal appointments  Return to MAU as needed Rx for Zofran   Follow-up Information    [facility] [place] MATERNITY ADMISSIONS Follow up.   Why:  Return to MAU as needed for increased nausea and vomiting with unable to keep down food and liquids, or for any other pregnancy emergencies  Contact information: [address] [id number] mc [place] [address] [phone]          Allergies as of 02/17/2018   No Known Allergies     Medication List    TAKE these medications   albuterol 1.25 MG/3ML nebulizer  solution Commonly known as:  ACCUNEB Take 1 ampule by nebulization every 6 (six) hours as needed for wheezing.   cyclobenzaprine 10 MG tablet Commonly known as:  FLEXERIL Take 1 tablet (10 mg total) by mouth 2 (two) times daily as needed for muscle spasms.   multivitamin capsule Take 1 capsule by mouth daily.   ondansetron 4 MG disintegrating tablet Commonly known as:  ZOFRAN ODT Take 1 tablet (4 mg total) by mouth every 8 (eight) hours as needed for nausea or vomiting.   promethazine 25 MG tablet Commonly known as:  PHENERGAN Take 0.5-1 tablets (12.5-25 mg total) by mouth every 6 (six) hours as needed.       Steward Drone  Certified Nurse-Midwife 02/17/2018  6:42 PM

## 2018-02-17 NOTE — Progress Notes (Addendum)
G3P0 @ 13.[redacted] wksga. Here for hyperemesis. Been taking phenergan and states does not help. States took one prior to going to work. Last threw up this morning after eating breakfast.   Denies LOF or bleeding.   Doppler 158  1607: IV attempted x2.   Another nurse stated will start IV prior to calling anesthesia.  1700 Off unit to 3rd floor. Charge nurse made aware.   1740 resumed care of pt. Multibag and zofran up. Pt resting quietly with eyes closed.   1821 Crackers and gingerale provided   1830 Iv d.c'd. Up to bathroom. Instructed pt to get dressed. Tolerated PO drink and crackers.  D/c orders received. Ordered to provide pt with work excuse for 8/6 and 8/7, 2019.   1854 D/c instructions given with pt understanding. Printed px and letter given.  Pt left unit via ambulatory.

## 2018-03-11 ENCOUNTER — Encounter: Payer: Self-pay | Admitting: Obstetrics

## 2018-03-11 ENCOUNTER — Ambulatory Visit (INDEPENDENT_AMBULATORY_CARE_PROVIDER_SITE_OTHER): Payer: Medicaid Other | Admitting: Obstetrics

## 2018-03-11 ENCOUNTER — Other Ambulatory Visit (HOSPITAL_COMMUNITY)
Admission: RE | Admit: 2018-03-11 | Discharge: 2018-03-11 | Disposition: A | Discharge status: Home / Self Care | Payer: Medicaid Other | Source: Ambulatory Visit | Attending: Obstetrics | Admitting: Obstetrics

## 2018-03-11 VITALS — BP 118/81 | HR 86 | Wt 200.0 lb

## 2018-03-11 DIAGNOSIS — Z3492 Encounter for supervision of normal pregnancy, unspecified, second trimester: Secondary | ICD-10-CM | POA: Insufficient documentation

## 2018-03-11 DIAGNOSIS — N898 Other specified noninflammatory disorders of vagina: Secondary | ICD-10-CM | POA: Diagnosis not present

## 2018-03-11 DIAGNOSIS — Z3493 Encounter for supervision of normal pregnancy, unspecified, third trimester: Secondary | ICD-10-CM

## 2018-03-11 DIAGNOSIS — Z3490 Encounter for supervision of normal pregnancy, unspecified, unspecified trimester: Secondary | ICD-10-CM | POA: Diagnosis not present

## 2018-03-11 DIAGNOSIS — Z3483 Encounter for supervision of other normal pregnancy, third trimester: Secondary | ICD-10-CM | POA: Diagnosis not present

## 2018-03-11 NOTE — Addendum Note (Signed)
Addended by: Hamilton CapriBURCH, Oral Remache J on: 03/11/2018 01:01 PM   Modules accepted: Orders

## 2018-03-11 NOTE — Progress Notes (Signed)
Subjective:    Carla Hammond is being seen today for her first obstetrical visit.  This is not a planned pregnancy. She is at 4339w3d gestation. Her obstetrical history is significant for none. Relationship with FOB: significant other, not living together. Patient does intend to breast feed. Pregnancy history fully reviewed.  The information documented in the HPI was reviewed and verified.  Menstrual History: OB History    Gravida  3   Para  0   Term      Preterm      AB  2   Living        SAB      TAB  2   Ectopic      Multiple      Live Births               Patient's last menstrual period was 11/16/2017.    Past Medical History:  Diagnosis Date  . Asthma     Past Surgical History:  Procedure Laterality Date  . INDUCED ABORTION       (Not in a hospital admission) No Known Allergies  Social History   Tobacco Use  . Smoking status: Never Smoker  . Smokeless tobacco: Never Used  Substance Use Topics  . Alcohol use: Not Currently    Comment: occ when not preg    Family History  Problem Relation Age of Onset  . Hypertension Mother   . Stroke Maternal Grandfather   . Heart disease Maternal Grandfather      Review of Systems Constitutional: negative for weight loss Gastrointestinal: negative for vomiting Genitourinary:negative for genital lesions and vaginal discharge and dysuria Musculoskeletal:negative for back pain Behavioral/Psych: negative for abusive relationship, depression, illegal drug usage and tobacco use    Objective:    BP 118/81   Pulse 86   Wt 200 lb (90.7 kg)   LMP 11/16/2017   BMI 35.43 kg/m  General Appearance:    Alert, cooperative, no distress, appears stated age  Head:    Normocephalic, without obvious abnormality, atraumatic  Eyes:    PERRL, conjunctiva/corneas clear, EOM's intact, fundi    benign, both eyes  Ears:    Normal TM's and external ear canals, both ears  Nose:   Nares normal, septum midline, mucosa  normal, no drainage    or sinus tenderness  Throat:   Lips, mucosa, and tongue normal; teeth and gums normal  Neck:   Supple, symmetrical, trachea midline, no adenopathy;    thyroid:  no enlargement/tenderness/nodules; no carotid   bruit or JVD  Back:     Symmetric, no curvature, ROM normal, no CVA tenderness  Lungs:     Clear to auscultation bilaterally, respirations unlabored  Chest Wall:    No tenderness or deformity   Heart:    Regular rate and rhythm, S1 and S2 normal, no murmur, rub   or gallop  Breast Exam:    No tenderness, masses, or nipple abnormality  Abdomen:     Soft, non-tender, bowel sounds active all four quadrants,    no masses, no organomegaly  Genitalia:    Normal female without lesion, discharge or tenderness  Extremities:   Extremities normal, atraumatic, no cyanosis or edema  Pulses:   2+ and symmetric all extremities  Skin:   Skin color, texture, turgor normal, no rashes or lesions  Lymph nodes:   Cervical, supraclavicular, and axillary nodes normal  Neurologic:   CNII-XII intact, normal strength, sensation and reflexes    throughout  Lab Review Urine pregnancy test Labs reviewed yes Radiologic studies reviewed no  Assessment:     1. Encounter for supervision of normal pregnancy, antepartum, unspecified gravidity Rx: - Cytology - PAP - Culture, OB Urine - Cystic Fibrosis Mutation 97 - Hemoglobinopathy evaluation - Obstetric Panel, Including HIV - Genetic Screening - SMN1 Copy Number Analysis - [place] MFM OB COMP + 14 WK; Future - AFP, Serum, Open Spina Bifida  2. Vaginal discharge Rx: - Cervicovaginal ancillary only    Plan:      Prenatal vitamins.  Counseling provided regarding continued use of seat belts, cessation of alcohol consumption, smoking or use of illicit drugs; infection precautions i.e., influenza/TDAP immunizations, toxoplasmosis,CMV, parvovirus, listeria and varicella; workplace safety, exercise during pregnancy; routine dental  care, safe medications, sexual activity, hot tubs, saunas, pools, travel, caffeine use, fish and methlymercury, potential toxins, hair treatments, varicose veins Weight gain recommendations per IOM guidelines reviewed: underweight/BMI< 18.5--> gain 28 - 40 lbs; normal weight/BMI 18.5 - 24.9--> gain 25 - 35 lbs; overweight/BMI 25 - 29.9--> gain 15 - 25 lbs; obese/BMI >30->gain  11 - 20 lbs Problem list reviewed and updated. FIRST/CF mutation testing/NIPT/QUAD SCREEN/fragile X/Ashkenazi Jewish population testing/Spinal muscular atrophy discussed: requested. Role of ultrasound in pregnancy discussed; fetal survey: requested. Amniocentesis discussed: not indicated.  No orders of the defined types were placed in this encounter.  Orders Placed This Encounter  Procedures  . Culture, OB Urine  . [place] MFM OB COMP + 14 WK    Standing Status:   Future    Standing Expiration Date:   05/12/2019    Order Specific Question:   Reason for Exam (SYMPTOM  OR DIAGNOSIS REQUIRED)    Answer:   Anatomy    Order Specific Question:   Preferred Imaging Location?    Answer:   [facility]-Ultrasound  . Cystic Fibrosis Mutation 97  . Hemoglobinopathy evaluation  . Obstetric Panel, Including HIV  . Genetic Screening  . SMN1 Copy Number Analysis    Follow up in 4 weeks. 50% of 20 min visit spent on counseling and coordination of care.     Brock Bad MD 03-11-2018

## 2018-03-12 LAB — CERVICOVAGINAL ANCILLARY ONLY
BACTERIAL VAGINITIS: NEGATIVE
CANDIDA VAGINITIS: NEGATIVE
CHLAMYDIA, DNA PROBE: NEGATIVE
Neisseria Gonorrhea: NEGATIVE
TRICH (WINDOWPATH): NEGATIVE

## 2018-03-12 LAB — CYTOLOGY - PAP: Diagnosis: NEGATIVE

## 2018-03-13 LAB — URINE CULTURE, OB REFLEX

## 2018-03-13 LAB — CULTURE, OB URINE

## 2018-03-22 ENCOUNTER — Encounter (HOSPITAL_COMMUNITY): Payer: Self-pay

## 2018-03-26 LAB — OBSTETRIC PANEL, INCLUDING HIV
Antibody Screen: NEGATIVE
BASOS ABS: 0 10*3/uL (ref 0.0–0.2)
Basos: 0 %
EOS (ABSOLUTE): 0.1 10*3/uL (ref 0.0–0.4)
Eos: 1 %
HEMOGLOBIN: 12.3 g/dL (ref 11.1–15.9)
HIV Screen 4th Generation wRfx: NONREACTIVE
Hematocrit: 36.1 % (ref 34.0–46.6)
Hepatitis B Surface Ag: NEGATIVE
IMMATURE GRANS (ABS): 0 10*3/uL (ref 0.0–0.1)
Immature Granulocytes: 0 %
LYMPHS ABS: 1.7 10*3/uL (ref 0.7–3.1)
Lymphs: 25 %
MCH: 31 pg (ref 26.6–33.0)
MCHC: 34.1 g/dL (ref 31.5–35.7)
MCV: 91 fL (ref 79–97)
MONOS ABS: 0.5 10*3/uL (ref 0.1–0.9)
Monocytes: 8 %
NEUTROS PCT: 66 %
Neutrophils Absolute: 4.6 10*3/uL (ref 1.4–7.0)
PLATELETS: 224 10*3/uL (ref 150–450)
RBC: 3.97 x10E6/uL (ref 3.77–5.28)
RDW: 14.1 % (ref 12.3–15.4)
RPR Ser Ql: NONREACTIVE
Rh Factor: POSITIVE
Rubella Antibodies, IGG: 1.29 index (ref >0.99)
WBC: 6.9 10*3/uL (ref 3.4–10.8)

## 2018-03-26 LAB — AFP, SERUM, OPEN SPINA BIFIDA
AFP MoM: 0.88
AFP Value: 27.7 ng/mL
GEST. AGE ON COLLECTION DATE: 16.4 wk
Maternal Age At EDD: 23.9 yr
OSBR RISK 1 IN: 10000
TEST RESULTS AFP: NEGATIVE
WEIGHT: 200 [lb_av]

## 2018-03-26 LAB — SMN1 COPY NUMBER ANALYSIS (SMA CARRIER SCREENING)

## 2018-03-26 LAB — HEMOGLOBINOPATHY EVALUATION
HEMOGLOBIN A2 QUANTITATION: 2.4 % (ref 1.8–3.2)
HGB A: 97.6 % (ref 96.4–98.8)
HGB C: 0 %
HGB S: 0 %
HGB VARIANT: 0 %
Hemoglobin F Quantitation: 0 % (ref 0.0–2.0)

## 2018-03-26 LAB — CYSTIC FIBROSIS MUTATION 97: GENE DIS ANAL CARRIER INTERP BLD/T-IMP: NOT DETECTED

## 2018-03-31 ENCOUNTER — Other Ambulatory Visit: Payer: Self-pay | Admitting: Obstetrics

## 2018-03-31 ENCOUNTER — Ambulatory Visit (HOSPITAL_COMMUNITY)
Admission: RE | Admit: 2018-03-31 | Discharge: 2018-03-31 | Disposition: A | Discharge status: Home / Self Care | Payer: Medicaid Other | Source: Ambulatory Visit | Attending: Obstetrics | Admitting: Obstetrics

## 2018-03-31 DIAGNOSIS — Z3490 Encounter for supervision of normal pregnancy, unspecified, unspecified trimester: Secondary | ICD-10-CM

## 2018-03-31 DIAGNOSIS — Z363 Encounter for antenatal screening for malformations: Secondary | ICD-10-CM | POA: Insufficient documentation

## 2018-03-31 DIAGNOSIS — O99512 Diseases of the respiratory system complicating pregnancy, second trimester: Secondary | ICD-10-CM | POA: Diagnosis not present

## 2018-03-31 DIAGNOSIS — O99212 Obesity complicating pregnancy, second trimester: Secondary | ICD-10-CM | POA: Diagnosis not present

## 2018-03-31 DIAGNOSIS — Z3A19 19 weeks gestation of pregnancy: Secondary | ICD-10-CM | POA: Insufficient documentation

## 2018-03-31 DIAGNOSIS — J45909 Unspecified asthma, uncomplicated: Secondary | ICD-10-CM | POA: Insufficient documentation

## 2018-04-07 ENCOUNTER — Other Ambulatory Visit: Payer: Self-pay

## 2018-04-07 ENCOUNTER — Encounter: Payer: Self-pay | Admitting: Obstetrics

## 2018-04-07 ENCOUNTER — Ambulatory Visit (INDEPENDENT_AMBULATORY_CARE_PROVIDER_SITE_OTHER): Payer: Medicaid Other | Admitting: Obstetrics

## 2018-04-07 DIAGNOSIS — Z3492 Encounter for supervision of normal pregnancy, unspecified, second trimester: Secondary | ICD-10-CM

## 2018-04-07 DIAGNOSIS — Z3490 Encounter for supervision of normal pregnancy, unspecified, unspecified trimester: Secondary | ICD-10-CM

## 2018-04-07 NOTE — Progress Notes (Signed)
Subjective:  Carla Hammond is a 22 y.o. G3P0020 at [redacted]w[redacted]d being seen today for ongoing prenatal care.  She is currently monitored for the following issues for this low-risk pregnancy and has Ovarian cyst, left and Encounter for supervision of normal pregnancy, unspecified, unspecified trimester on their problem list.  Patient reports CONSTIPATION.  Contractions: Not present. Vag. Bleeding: None.  Movement: Present. Denies leaking of fluid.   The following portions of the patient's history were reviewed and updated as appropriate: allergies, current medications, past family history, past medical history, past social history, past surgical history and problem list. Problem list updated.  Objective:   Vitals:   04/07/18 0955  BP: 136/79  Pulse: 83  Weight: 207 lb 12.8 oz (94.3 kg)    Fetal Status: Fetal Heart Rate (bpm): 150   Movement: Present     General:  Alert, oriented and cooperative. Patient is in no acute distress.  Skin: Skin is warm and dry. No rash noted.   Cardiovascular: Normal heart rate noted  Respiratory: Normal respiratory effort, no problems with respiration noted  Abdomen: Soft, gravid, appropriate for gestational age. Pain/Pressure: Present     Pelvic:  Cervical exam deferred        Extremities: Normal range of motion.  Edema: None  Mental Status: Normal mood and affect. Normal behavior. Normal judgment and thought content.   Urinalysis:      Assessment and Plan:  Pregnancy: G3P0020 at [redacted]w[redacted]d  1. Encounter for supervision of normal pregnancy, antepartum, unspecified gravidity   Preterm labor symptoms and general obstetric precautions including but not limited to vaginal bleeding, contractions, leaking of fluid and fetal movement were reviewed in detail with the patient. Please refer to After Visit Summary for other counseling recommendations.  Return in about 4 weeks (around 05/05/2018) for ROB.   Brock Bad, MD

## 2018-04-07 NOTE — Progress Notes (Signed)
ROB.  Declined FLU vaccine.  C/o constipation

## 2018-05-05 ENCOUNTER — Ambulatory Visit (INDEPENDENT_AMBULATORY_CARE_PROVIDER_SITE_OTHER): Payer: Medicaid Other | Admitting: Obstetrics

## 2018-05-05 ENCOUNTER — Encounter: Payer: Self-pay | Admitting: Obstetrics

## 2018-05-05 VITALS — BP 136/77 | HR 99 | Wt 210.0 lb

## 2018-05-05 DIAGNOSIS — Z3482 Encounter for supervision of other normal pregnancy, second trimester: Secondary | ICD-10-CM

## 2018-05-05 DIAGNOSIS — Z3490 Encounter for supervision of normal pregnancy, unspecified, unspecified trimester: Secondary | ICD-10-CM

## 2018-05-05 NOTE — Progress Notes (Signed)
Subjective:  Carla Hammond is a 22 y.o. G3P0020 at [redacted]w[redacted]d being seen today for ongoing prenatal care.  She is currently monitored for the following issues for this low-risk pregnancy and has Ovarian cyst, left and Encounter for supervision of normal pregnancy, unspecified, unspecified trimester on their problem list.  Patient reports no complaints.  Contractions: Not present. Vag. Bleeding: None.  Movement: Present. Denies leaking of fluid.   The following portions of the patient's history were reviewed and updated as appropriate: allergies, current medications, past family history, past medical history, past social history, past surgical history and problem list. Problem list updated.  Objective:   Vitals:   05/05/18 0947  BP: 136/77  Pulse: 99  Weight: 210 lb (95.3 kg)    Fetal Status: Fetal Heart Rate (bpm): 150   Movement: Present     General:  Alert, oriented and cooperative. Patient is in no acute distress.  Skin: Skin is warm and dry. No rash noted.   Cardiovascular: Normal heart rate noted  Respiratory: Normal respiratory effort, no problems with respiration noted  Abdomen: Soft, gravid, appropriate for gestational age. Pain/Pressure: Present     Pelvic:  Cervical exam deferred        Extremities: Normal range of motion.  Edema: Trace  Mental Status: Normal mood and affect. Normal behavior. Normal judgment and thought content.   Urinalysis:      Assessment and Plan:  Pregnancy: G3P0020 at [redacted]w[redacted]d  1. Encounter for supervision of normal pregnancy, antepartum, unspecified gravidity   Preterm labor symptoms and general obstetric precautions including but not limited to vaginal bleeding, contractions, leaking of fluid and fetal movement were reviewed in detail with the patient. Please refer to After Visit Summary for other counseling recommendations.  Return in about 4 weeks (around 06/02/2018) for ROB, 2 hour OGTT.   Brock Bad, MD

## 2018-05-06 ENCOUNTER — Telehealth: Payer: Self-pay | Admitting: *Deleted

## 2018-05-06 NOTE — Telephone Encounter (Signed)
Attempt to contact pt regarding forms left in office yesterday. No answer, no voicemail.

## 2018-05-07 ENCOUNTER — Encounter: Payer: Self-pay | Admitting: Obstetrics

## 2018-05-07 ENCOUNTER — Other Ambulatory Visit: Payer: Self-pay

## 2018-05-07 ENCOUNTER — Ambulatory Visit (INDEPENDENT_AMBULATORY_CARE_PROVIDER_SITE_OTHER): Payer: Medicaid Other | Admitting: Obstetrics

## 2018-05-07 VITALS — BP 122/73 | HR 80 | Wt 211.7 lb

## 2018-05-07 DIAGNOSIS — Z3492 Encounter for supervision of normal pregnancy, unspecified, second trimester: Secondary | ICD-10-CM

## 2018-05-07 DIAGNOSIS — Z3490 Encounter for supervision of normal pregnancy, unspecified, unspecified trimester: Secondary | ICD-10-CM

## 2018-05-07 NOTE — Progress Notes (Signed)
Subjective:  Carla Hammond is a 22 y.o. G3P0020 at [redacted]w[redacted]d being seen today for ongoing prenatal care.  She is currently monitored for the following issues for this low-risk pregnancy and has Ovarian cyst, left and Encounter for supervision of normal pregnancy, unspecified, unspecified trimester on their problem list.  Patient reports occasional contractions.  Contractions: Irregular. Vag. Bleeding: None.  Movement: Present. Denies leaking of fluid.   The following portions of the patient's history were reviewed and updated as appropriate: allergies, current medications, past family history, past medical history, past social history, past surgical history and problem list. Problem list updated.  Objective:   Vitals:   05/07/18 0905  BP: 122/73  Pulse: 80  Weight: 211 lb 11.2 oz (96 kg)    Fetal Status: Fetal Heart Rate (bpm): 150   Movement: Present     General:  Alert, oriented and cooperative. Patient is in no acute distress.  Skin: Skin is warm and dry. No rash noted.   Cardiovascular: Normal heart rate noted  Respiratory: Normal respiratory effort, no problems with respiration noted  Abdomen: Soft, gravid, appropriate for gestational age. Pain/Pressure: Present     Pelvic:  Cervical exam deferred        Extremities: Normal range of motion.  Edema: Trace  Mental Status: Normal mood and affect. Normal behavior. Normal judgment and thought content.   Urinalysis:      Toco:  No UC's.  Reassuring NST  Assessment and Plan:  Pregnancy: G3P0020 at [redacted]w[redacted]d  1. Encounter for supervision of normal pregnancy, antepartum, unspecified gravidity   Preterm labor symptoms and general obstetric precautions including but not limited to vaginal bleeding, contractions, leaking of fluid and fetal movement were reviewed in detail with the patient. Please refer to After Visit Summary for other counseling recommendations.  Return in about 4 weeks (around 06/04/2018) for ROB, 2 hour  OGTT.   Brock Bad, MD

## 2018-05-07 NOTE — Progress Notes (Signed)
ROB.  C/o irregular contractions, started this morning.

## 2018-05-17 ENCOUNTER — Inpatient Hospital Stay (HOSPITAL_COMMUNITY)
Admission: AD | Admit: 2018-05-17 | Discharge: 2018-05-17 | Disposition: A | Discharge status: Home / Self Care | Payer: Medicaid Other | Source: Ambulatory Visit | Attending: Obstetrics and Gynecology | Admitting: Obstetrics and Gynecology

## 2018-05-17 ENCOUNTER — Encounter (HOSPITAL_COMMUNITY): Payer: Self-pay

## 2018-05-17 DIAGNOSIS — R109 Unspecified abdominal pain: Secondary | ICD-10-CM | POA: Diagnosis not present

## 2018-05-17 DIAGNOSIS — O4702 False labor before 37 completed weeks of gestation, second trimester: Secondary | ICD-10-CM

## 2018-05-17 DIAGNOSIS — Z3A26 26 weeks gestation of pregnancy: Secondary | ICD-10-CM | POA: Insufficient documentation

## 2018-05-17 DIAGNOSIS — O479 False labor, unspecified: Secondary | ICD-10-CM

## 2018-05-17 DIAGNOSIS — Z3492 Encounter for supervision of normal pregnancy, unspecified, second trimester: Secondary | ICD-10-CM

## 2018-05-17 NOTE — MAU Provider Note (Signed)
  History     CSN: [id number]  Arrival date and time: 05/17/18 1514   None     Chief Complaint  Patient presents with  . Abdominal Pain   HPI Carla Hammond is 22 y.o. G3P0020 [redacted]w[redacted]d weeks presenting with report of mid abdominal pressure "baby tugging on my belly button" that began earlier today.  Sits at works.  She is a patient at [facility] (next appt 05/26/2018).  Was seen last week in the office for ? Deberah Pelton.  States by the time she got there, contractions stopped. States "she is very active"/  Neg for abdominal pain, vaginal bleeding and loss of fluid.    Past Medical History:  Diagnosis Date  . Asthma     Past Surgical History:  Procedure Laterality Date  . INDUCED ABORTION      Family History  Problem Relation Age of Onset  . Hypertension Mother   . Stroke Maternal Grandfather   . Heart disease Maternal Grandfather     Social History   Tobacco Use  . Smoking status: Never Smoker  . Smokeless tobacco: Never Used  Substance Use Topics  . Alcohol use: Not Currently    Comment: occ when not preg  . Drug use: Never    Allergies: No Known Allergies  Medications Prior to Admission  Medication Sig Dispense Refill Last Dose  . albuterol (ACCUNEB) 1.25 MG/3ML nebulizer solution Take 1 ampule by nebulization every 6 (six) hours as needed for wheezing.   Taking  . Multiple Vitamin (MULTIVITAMIN) capsule Take 1 capsule by mouth daily.   Taking  . ondansetron (ZOFRAN ODT) 4 MG disintegrating tablet Take 1 tablet (4 mg total) by mouth every 8 (eight) hours as needed for nausea or vomiting. 30 tablet 1 Taking    Review of Systems  Constitutional: Negative for activity change and appetite change.  Respiratory: Negative for shortness of breath.   Cardiovascular: Negative for chest pain.  Gastrointestinal: Positive for abdominal pain (pulling at umbilicus).  Genitourinary: Negative for dysuria, flank pain, vaginal bleeding and vaginal discharge.   Psychiatric/Behavioral: Negative for agitation.   Physical Exam   Blood pressure 119/75, pulse 84, temperature 97.7 F (36.5 C), temperature source Oral, resp. rate 18, weight 98.2 kg, last menstrual period 11/16/2017, unknown if currently breastfeeding.  Physical Exam  Constitutional: She is oriented to person, place, and time. She appears well-developed and well-nourished. No distress.  HENT:  Head: Normocephalic.  GI: Soft. There is no tenderness. There is no rebound and no guarding.  Genitourinary:  Genitourinary Comments: Deferred with no vaginal bleeding and no contractions noted on NST.  Neurological: She is alert and oriented to person, place, and time.  Skin: Skin is warm and dry.  Psychiatric: She has a normal mood and affect. Her behavior is normal.    MAU Course  Procedures  MDM MSE Exam NST--baseline 145bpm           Moderate variability            10x10s noted             Neg for contractions       Active fetus   Assessment and Plan  A: Braxton Hicks contractions     [redacted]w[redacted]d gestation  P:  Keep scheduled appointment with [facility] for 11/13      Return for vaginal bleeding, loss of fluid or increase in contractions  Eve M Amrom Ore 05/17/2018, 3:44 PM

## 2018-05-17 NOTE — Discharge Instructions (Signed)
Braxton Hicks Contractions °Contractions of the uterus can occur throughout pregnancy, but they are not always a sign that you are in labor. You may have practice contractions called Braxton Hicks contractions. These false labor contractions are sometimes confused with true labor. °What are Braxton Hicks contractions? °Braxton Hicks contractions are tightening movements that occur in the muscles of the uterus before labor. Unlike true labor contractions, these contractions do not result in opening (dilation) and thinning of the cervix. Toward the end of pregnancy (32-34 weeks), Braxton Hicks contractions can happen more often and may become stronger. These contractions are sometimes difficult to tell apart from true labor because they can be very uncomfortable. You should not feel embarrassed if you go to the hospital with false labor. °Sometimes, the only way to tell if you are in true labor is for your health care provider to look for changes in the cervix. The health care provider will do a physical exam and may monitor your contractions. If you are not in true labor, the exam should show that your cervix is not dilating and your water has not broken. °If there are other health problems associated with your pregnancy, it is completely safe for you to be sent home with false labor. You may continue to have Braxton Hicks contractions until you go into true labor. °How to tell the difference between true labor and false labor °True labor °· Contractions last 30-70 seconds. °· Contractions become very regular. °· Discomfort is usually felt in the top of the uterus, and it spreads to the lower abdomen and low back. °· Contractions do not go away with walking. °· Contractions usually become more intense and increase in frequency. °· The cervix dilates and gets thinner. °False labor °· Contractions are usually shorter and not as strong as true labor contractions. °· Contractions are usually irregular. °· Contractions  are often felt in the front of the lower abdomen and in the groin. °· Contractions may go away when you walk around or change positions while lying down. °· Contractions get weaker and are shorter-lasting as time goes on. °· The cervix usually does not dilate or become thin. °Follow these instructions at home: °· Take over-the-counter and prescription medicines only as told by your health care provider. °· Keep up with your usual exercises and follow other instructions from your health care provider. °· Eat and drink lightly if you think you are going into labor. °· If Braxton Hicks contractions are making you uncomfortable: °? Change your position from lying down or resting to walking, or change from walking to resting. °? Sit and rest in a tub of warm water. °? Drink enough fluid to keep your urine pale yellow. Dehydration may cause these contractions. °? Do slow and deep breathing several times an hour. °· Keep all follow-up prenatal visits as told by your health care provider. This is important. °Contact a health care provider if: °· You have a fever. °· You have continuous pain in your abdomen. °Get help right away if: °· Your contractions become stronger, more regular, and closer together. °· You have fluid leaking or gushing from your vagina. °· You pass blood-tinged mucus (bloody show). °· You have bleeding from your vagina. °· You have low back pain that you never had before. °· You feel your baby’s head pushing down and causing pelvic pressure. °· Your baby is not moving inside you as much as it used to. °Summary °· Contractions that occur before labor are called Braxton   Hicks contractions, false labor, or practice contractions. °· Braxton Hicks contractions are usually shorter, weaker, farther apart, and less regular than true labor contractions. True labor contractions usually become progressively stronger and regular and they become more frequent. °· Manage discomfort from Braxton Hicks contractions by  changing position, resting in a warm bath, drinking plenty of water, or practicing deep breathing. °This information is not intended to replace advice given to you by your health care provider. Make sure you discuss any questions you have with your health care provider. °Document Released: 11/13/2016 Document Revised: 11/13/2016 Document Reviewed: 11/13/2016 °Elsevier Interactive Patient Education © 2018 Elsevier Inc. ° °

## 2018-05-17 NOTE — MAU Note (Signed)
Feeling tightening pain around her belly button since this AM, feels pain most with fetal movement, pain is intermittent  No bleeding, no LOF

## 2018-05-26 ENCOUNTER — Ambulatory Visit (INDEPENDENT_AMBULATORY_CARE_PROVIDER_SITE_OTHER): Payer: Medicaid Other | Admitting: Obstetrics and Gynecology

## 2018-05-26 ENCOUNTER — Encounter: Payer: Self-pay | Admitting: Obstetrics and Gynecology

## 2018-05-26 ENCOUNTER — Other Ambulatory Visit: Payer: Medicaid Other

## 2018-05-26 VITALS — BP 123/89 | HR 105 | Wt 215.0 lb

## 2018-05-26 DIAGNOSIS — Z3490 Encounter for supervision of normal pregnancy, unspecified, unspecified trimester: Secondary | ICD-10-CM | POA: Diagnosis not present

## 2018-05-26 DIAGNOSIS — Z3482 Encounter for supervision of other normal pregnancy, second trimester: Secondary | ICD-10-CM

## 2018-05-26 NOTE — Progress Notes (Signed)
   PRENATAL VISIT NOTE  Subjective:  Carla Hammond is a 22 y.o. G3P0020 at 5767w2d being seen today for ongoing prenatal care.  She is currently monitored for the following issues for this low-risk pregnancy and has Encounter for supervision of normal pregnancy, unspecified, unspecified trimester on their problem list.  Patient reports no complaints.  Contractions: Not present. Vag. Bleeding: None.  Movement: Present. Denies leaking of fluid.   The following portions of the patient's history were reviewed and updated as appropriate: allergies, current medications, past family history, past medical history, past social history, past surgical history and problem list. Problem list updated.  Objective:   Vitals:   05/26/18 0910  BP: 123/89  Pulse: (!) 105  Weight: 215 lb (97.5 kg)    Fetal Status: Fetal Heart Rate (bpm): 155 Fundal Height: 27 cm Movement: Present     General:  Alert, oriented and cooperative. Patient is in no acute distress.  Skin: Skin is warm and dry. No rash noted.   Cardiovascular: Normal heart rate noted  Respiratory: Normal respiratory effort, no problems with respiration noted  Abdomen: Soft, gravid, appropriate for gestational age.  Pain/Pressure: Present     Pelvic: Cervical exam deferred        Extremities: Normal range of motion.     Mental Status: Normal mood and affect. Normal behavior. Normal judgment and thought content.   Assessment and Plan:  Pregnancy: G3P0020 at 7067w2d  1. Encounter for supervision of normal pregnancy, antepartum, unspecified gravidity Patient is doing well Third trimester labs and glucola today - Glucose Tolerance, 2 Hours w/1 Hour - CBC - RPR - HIV Antibody (routine testing w rflx)  Preterm labor symptoms and general obstetric precautions including but not limited to vaginal bleeding, contractions, leaking of fluid and fetal movement were reviewed in detail with the patient. Please refer to After Visit Summary for other  counseling recommendations.  No follow-ups on file.  Future Appointments  Date Time Provider Department Center  05/26/2018  9:45 AM Aikam Vinje, Gigi GinPeggy, MD [facility] None    Catalina AntiguaPeggy Tamiyah Moulin, MD

## 2018-05-26 NOTE — Patient Instructions (Signed)
Contraception Choices Contraception, also called birth control, refers to methods or devices that prevent pregnancy. Hormonal methods Contraceptive implant A contraceptive implant is a thin, plastic tube that contains a hormone. It is inserted into the upper part of the arm. It can remain in place for up to 3 years. Progestin-only injections Progestin-only injections are injections of progestin, a synthetic form of the hormone progesterone. They are given every 3 months by a health care provider. Birth control pills Birth control pills are pills that contain hormones that prevent pregnancy. They must be taken once a day, preferably at the same time each day. Birth control patch The birth control patch contains hormones that prevent pregnancy. It is placed on the skin and must be changed once a week for three weeks and removed on the fourth week. A prescription is needed to use this method of contraception. Vaginal ring A vaginal ring contains hormones that prevent pregnancy. It is placed in the vagina for three weeks and removed on the fourth week. After that, the process is repeated with a new ring. A prescription is needed to use this method of contraception. Emergency contraceptive Emergency contraceptives prevent pregnancy after unprotected sex. They come in pill form and can be taken up to 5 days after sex. They work best the sooner they are taken after having sex. Most emergency contraceptives are available without a prescription. This method should not be used as your only form of birth control. Barrier methods Female condom A female condom is a thin sheath that is worn over the penis during sex. Condoms keep sperm from going inside a woman's body. They can be used with a spermicide to increase their effectiveness. They should be disposed after a single use. Female condom A female condom is a soft, loose-fitting sheath that is put into the vagina before sex. The condom keeps sperm from going  inside a woman's body. They should be disposed after a single use. Diaphragm A diaphragm is a soft, dome-shaped barrier. It is inserted into the vagina before sex, along with a spermicide. The diaphragm blocks sperm from entering the uterus, and the spermicide kills sperm. A diaphragm should be left in the vagina for 6-8 hours after sex and removed within 24 hours. A diaphragm is prescribed and fitted by a health care provider. A diaphragm should be replaced every 1-2 years, after giving birth, after gaining more than 15 lb (6.8 kg), and after pelvic surgery. Cervical cap A cervical cap is a round, soft latex or plastic cup that fits over the cervix. It is inserted into the vagina before sex, along with spermicide. It blocks sperm from entering the uterus. The cap should be left in place for 6-8 hours after sex and removed within 48 hours. A cervical cap must be prescribed and fitted by a health care provider. It should be replaced every 2 years. Sponge A sponge is a soft, circular piece of polyurethane foam with spermicide on it. The sponge helps block sperm from entering the uterus, and the spermicide kills sperm. To use it, you make it wet and then insert it into the vagina. It should be inserted before sex, left in for at least 6 hours after sex, and removed and thrown away within 30 hours. Spermicides Spermicides are chemicals that kill or block sperm from entering the cervix and uterus. They can come as a cream, jelly, suppository, foam, or tablet. A spermicide should be inserted into the vagina with an applicator at least 10-15 minutes before   sex to allow time for it to work. The process must be repeated every time you have sex. Spermicides do not require a prescription. Intrauterine contraception Intrauterine device (IUD) An IUD is a T-shaped device that is put in a woman's uterus. There are two types:  Hormone IUD.This type contains progestin, a synthetic form of the hormone progesterone. This  type can stay in place for 3-5 years.  Copper IUD.This type is wrapped in copper wire. It can stay in place for 10 years.  Permanent methods of contraception Female tubal ligation In this method, a woman's fallopian tubes are sealed, tied, or blocked during surgery to prevent eggs from traveling to the uterus. Hysteroscopic sterilization In this method, a small, flexible insert is placed into each fallopian tube. The inserts cause scar tissue to form in the fallopian tubes and block them, so sperm cannot reach an egg. The procedure takes about 3 months to be effective. Another form of birth control must be used during those 3 months. Female sterilization This is a procedure to tie off the tubes that carry sperm (vasectomy). After the procedure, the man can still ejaculate fluid (semen). Natural planning methods Natural family planning In this method, a couple does not have sex on days when the woman could become pregnant. Calendar method This means keeping track of the length of each menstrual cycle, identifying the days when pregnancy can happen, and not having sex on those days. Ovulation method In this method, a couple avoids sex during ovulation. Symptothermal method This method involves not having sex during ovulation. The woman typically checks for ovulation by watching changes in her temperature and in the consistency of cervical mucus. Post-ovulation method In this method, a couple waits to have sex until after ovulation. Summary  Contraception, also called birth control, means methods or devices that prevent pregnancy.  Hormonal methods of contraception include implants, injections, pills, patches, vaginal rings, and emergency contraceptives.  Barrier methods of contraception can include female condoms, female condoms, diaphragms, cervical caps, sponges, and spermicides.  There are two types of IUDs (intrauterine devices). An IUD can be put in a woman's uterus to prevent pregnancy  for 3-5 years.  Permanent sterilization can be done through a procedure for males, females, or both.  Natural family planning methods involve not having sex on days when the woman could become pregnant. This information is not intended to replace advice given to you by your health care provider. Make sure you discuss any questions you have with your health care provider. Document Released: 06/30/2005 Document Revised: 08/02/2016 Document Reviewed: 08/02/2016 Elsevier Interactive Patient Education  2018 Elsevier Inc.  

## 2018-05-27 LAB — GLUCOSE TOLERANCE, 2 HOURS W/ 1HR
GLUCOSE, 1 HOUR: 122 mg/dL (ref 65–179)
GLUCOSE, FASTING: 83 mg/dL (ref 65–91)
Glucose, 2 hour: 94 mg/dL (ref 65–152)

## 2018-05-27 LAB — CBC
Hematocrit: 34.4 % (ref 34.0–46.6)
Hemoglobin: 11.6 g/dL (ref 11.1–15.9)
MCH: 31.1 pg (ref 26.6–33.0)
MCHC: 33.7 g/dL (ref 31.5–35.7)
MCV: 92 fL (ref 79–97)
PLATELETS: 206 10*3/uL (ref 150–450)
RBC: 3.73 x10E6/uL — ABNORMAL LOW (ref 3.77–5.28)
RDW: 12.2 % — AB (ref 12.3–15.4)
WBC: 9.2 10*3/uL (ref 3.4–10.8)

## 2018-05-27 LAB — RPR: RPR: NONREACTIVE

## 2018-05-27 LAB — HIV ANTIBODY (ROUTINE TESTING W REFLEX): HIV SCREEN 4TH GENERATION: NONREACTIVE

## 2018-06-09 ENCOUNTER — Ambulatory Visit (INDEPENDENT_AMBULATORY_CARE_PROVIDER_SITE_OTHER): Payer: Medicaid Other | Admitting: Obstetrics and Gynecology

## 2018-06-09 ENCOUNTER — Encounter: Payer: Self-pay | Admitting: Obstetrics and Gynecology

## 2018-06-09 DIAGNOSIS — Z3490 Encounter for supervision of normal pregnancy, unspecified, unspecified trimester: Secondary | ICD-10-CM

## 2018-06-09 DIAGNOSIS — Z3493 Encounter for supervision of normal pregnancy, unspecified, third trimester: Secondary | ICD-10-CM

## 2018-06-09 DIAGNOSIS — Z3A29 29 weeks gestation of pregnancy: Secondary | ICD-10-CM

## 2018-06-09 NOTE — Progress Notes (Signed)
Pt c/o cold sx's.  Pt wants to discuss 2 hr GTT results.  Pt wants to know baby's wt.  Declines Tdap today.

## 2018-06-09 NOTE — Patient Instructions (Signed)
Third Trimester of Pregnancy The third trimester is from week 28 through week 40 (months 7 through 9). The third trimester is a time when the unborn baby (fetus) is growing rapidly. At the end of the ninth month, the fetus is about 20 inches in length and weighs 6-10 pounds. Body changes during your third trimester Your body will continue to go through many changes during pregnancy. The changes vary from woman to woman. During the third trimester:  Your weight will continue to increase. You can expect to gain 25-35 pounds (11-16 kg) by the end of the pregnancy.  You may begin to get stretch marks on your hips, abdomen, and breasts.  You may urinate more often because the fetus is moving lower into your pelvis and pressing on your bladder.  You may develop or continue to have heartburn. This is caused by increased hormones that slow down muscles in the digestive tract.  You may develop or continue to have constipation because increased hormones slow digestion and cause the muscles that push waste through your intestines to relax.  You may develop hemorrhoids. These are swollen veins (varicose veins) in the rectum that can itch or be painful.  You may develop swollen, bulging veins (varicose veins) in your legs.  You may have increased body aches in the pelvis, back, or thighs. This is due to weight gain and increased hormones that are relaxing your joints.  You may have changes in your hair. These can include thickening of your hair, rapid growth, and changes in texture. Some women also have hair loss during or after pregnancy, or hair that feels dry or thin. Your hair will most likely return to normal after your baby is born.  Your breasts will continue to grow and they will continue to become tender. A yellow fluid (colostrum) may leak from your breasts. This is the first milk you are producing for your baby.  Your belly button may stick out.  You may notice more swelling in your hands,  face, or ankles.  You may have increased tingling or numbness in your hands, arms, and legs. The skin on your belly may also feel numb.  You may feel short of breath because of your expanding uterus.  You may have more problems sleeping. This can be caused by the size of your belly, increased need to urinate, and an increase in your body's metabolism.  You may notice the fetus "dropping," or moving lower in your abdomen (lightening).  You may have increased vaginal discharge.  You may notice your joints feel loose and you may have pain around your pelvic bone.  What to expect at prenatal visits You will have prenatal exams every 2 weeks until week 36. Then you will have weekly prenatal exams. During a routine prenatal visit:  You will be weighed to make sure you and the baby are growing normally.  Your blood pressure will be taken.  Your abdomen will be measured to track your baby's growth.  The fetal heartbeat will be listened to.  Any test results from the previous visit will be discussed.  You may have a cervical check near your due date to see if your cervix has softened or thinned (effaced).  You will be tested for Group B streptococcus. This happens between 35 and 37 weeks.  Your health care provider may ask you:  What your birth plan is.  How you are feeling.  If you are feeling the baby move.  If you have had   any abnormal symptoms, such as leaking fluid, bleeding, severe headaches, or abdominal cramping.  If you are using any tobacco products, including cigarettes, chewing tobacco, and electronic cigarettes.  If you have any questions.  Other tests or screenings that may be performed during your third trimester include:  Blood tests that check for low iron levels (anemia).  Fetal testing to check the health, activity level, and growth of the fetus. Testing is done if you have certain medical conditions or if there are problems during the  pregnancy.  Nonstress test (NST). This test checks the health of your baby to make sure there are no signs of problems, such as the baby not getting enough oxygen. During this test, a belt is placed around your belly. The baby is made to move, and its heart rate is monitored during movement.  What is false labor? False labor is a condition in which you feel small, irregular tightenings of the muscles in the womb (contractions) that usually go away with rest, changing position, or drinking water. These are called Braxton Hicks contractions. Contractions may last for hours, days, or even weeks before true labor sets in. If contractions come at regular intervals, become more frequent, increase in intensity, or become painful, you should see your health care provider. What are the signs of labor?  Abdominal cramps.  Regular contractions that start at 10 minutes apart and become stronger and more frequent with time.  Contractions that start on the top of the uterus and spread down to the lower abdomen and back.  Increased pelvic pressure and dull back pain.  A watery or bloody mucus discharge that comes from the vagina.  Leaking of amniotic fluid. This is also known as your "water breaking." It could be a slow trickle or a gush. Let your health care provider know if it has a color or strange odor. If you have any of these signs, call your health care provider right away, even if it is before your due date. Follow these instructions at home: Medicines  Follow your health care provider's instructions regarding medicine use. Specific medicines may be either safe or unsafe to take during pregnancy.  Take a prenatal vitamin that contains at least 600 micrograms (mcg) of folic acid.  If you develop constipation, try taking a stool softener if your health care provider approves. Eating and drinking  Eat a balanced diet that includes fresh fruits and vegetables, whole grains, good sources of protein  such as meat, eggs, or tofu, and low-fat dairy. Your health care provider will help you determine the amount of weight gain that is right for you.  Avoid raw meat and uncooked cheese. These carry germs that can cause birth defects in the baby.  If you have low calcium intake from food, talk to your health care provider about whether you should take a daily calcium supplement.  Eat four or five small meals rather than three large meals a day.  Limit foods that are high in fat and processed sugars, such as fried and sweet foods.  To prevent constipation: ? Drink enough fluid to keep your urine clear or pale yellow. ? Eat foods that are high in fiber, such as fresh fruits and vegetables, whole grains, and beans. Activity  Exercise only as directed by your health care provider. Most women can continue their usual exercise routine during pregnancy. Try to exercise for 30 minutes at least 5 days a week. Stop exercising if you experience uterine contractions.  Avoid heavy   lifting.  Do not exercise in extreme heat or humidity, or at high altitudes.  Wear low-heel, comfortable shoes.  Practice good posture.  You may continue to have sex unless your health care provider tells you otherwise. Relieving pain and discomfort  Take frequent breaks and rest with your legs elevated if you have leg cramps or low back pain.  Take warm sitz baths to soothe any pain or discomfort caused by hemorrhoids. Use hemorrhoid cream if your health care provider approves.  Wear a good support bra to prevent discomfort from breast tenderness.  If you develop varicose veins: ? Wear support pantyhose or compression stockings as told by your healthcare provider. ? Elevate your feet for 15 minutes, 3-4 times a day. Prenatal care  Write down your questions. Take them to your prenatal visits.  Keep all your prenatal visits as told by your health care provider. This is important. Safety  Wear your seat belt at  all times when driving.  Make a list of emergency phone numbers, including numbers for family, friends, the hospital, and police and fire departments. General instructions  Avoid cat litter boxes and soil used by cats. These carry germs that can cause birth defects in the baby. If you have a cat, ask someone to clean the litter box for you.  Do not travel far distances unless it is absolutely necessary and only with the approval of your health care provider.  Do not use hot tubs, steam rooms, or saunas.  Do not drink alcohol.  Do not use any products that contain nicotine or tobacco, such as cigarettes and e-cigarettes. If you need help quitting, ask your health care provider.  Do not use any medicinal herbs or unprescribed drugs. These chemicals affect the formation and growth of the baby.  Do not douche or use tampons or scented sanitary pads.  Do not cross your legs for long periods of time.  To prepare for the arrival of your baby: ? Take prenatal classes to understand, practice, and ask questions about labor and delivery. ? Make a trial run to the hospital. ? Visit the hospital and tour the maternity area. ? Arrange for maternity or paternity leave through employers. ? Arrange for family and friends to take care of pets while you are in the hospital. ? Purchase a rear-facing car seat and make sure you know how to install it in your car. ? Pack your hospital bag. ? Prepare the baby's nursery. Make sure to remove all pillows and stuffed animals from the baby's crib to prevent suffocation.  Visit your dentist if you have not gone during your pregnancy. Use a soft toothbrush to brush your teeth and be gentle when you floss. Contact a health care provider if:  You are unsure if you are in labor or if your water has broken.  You become dizzy.  You have mild pelvic cramps, pelvic pressure, or nagging pain in your abdominal area.  You have lower back pain.  You have persistent  nausea, vomiting, or diarrhea.  You have an unusual or bad smelling vaginal discharge.  You have pain when you urinate. Get help right away if:  Your water breaks before 37 weeks.  You have regular contractions less than 5 minutes apart before 37 weeks.  You have a fever.  You are leaking fluid from your vagina.  You have spotting or bleeding from your vagina.  You have severe abdominal pain or cramping.  You have rapid weight loss or weight gain.    You have shortness of breath with chest pain.  You notice sudden or extreme swelling of your face, hands, ankles, feet, or legs.  Your baby makes fewer than 10 movements in 2 hours.  You have severe headaches that do not go away when you take medicine.  You have vision changes. Summary  The third trimester is from week 28 through week 40, months 7 through 9. The third trimester is a time when the unborn baby (fetus) is growing rapidly.  During the third trimester, your discomfort may increase as you and your baby continue to gain weight. You may have abdominal, leg, and back pain, sleeping problems, and an increased need to urinate.  During the third trimester your breasts will keep growing and they will continue to become tender. A yellow fluid (colostrum) may leak from your breasts. This is the first milk you are producing for your baby.  False labor is a condition in which you feel small, irregular tightenings of the muscles in the womb (contractions) that eventually go away. These are called Braxton Hicks contractions. Contractions may last for hours, days, or even weeks before true labor sets in.  Signs of labor can include: abdominal cramps; regular contractions that start at 10 minutes apart and become stronger and more frequent with time; watery or bloody mucus discharge that comes from the vagina; increased pelvic pressure and dull back pain; and leaking of amniotic fluid. This information is not intended to replace advice  given to you by your health care provider. Make sure you discuss any questions you have with your health care provider. Document Released: 06/24/2001 Document Revised: 12/06/2015 Document Reviewed: 08/31/2012 Elsevier Interactive Patient Education  2017 [employer]  

## 2018-06-09 NOTE — Progress Notes (Signed)
Subjective:  Carla Hammond is a 22 y.o. G3P0020 at 2856w2d being seen today for ongoing prenatal care.  She is currently monitored for the following issues for this low-risk pregnancy and has Encounter for supervision of normal pregnancy, unspecified, unspecified trimester on their problem list.  Patient reports general discomforts of pregnancy and URI Sx..  Contractions: Irritability. Vag. Bleeding: None.  Movement: Present. Denies leaking of fluid.   The following portions of the patient's history were reviewed and updated as appropriate: allergies, current medications, past family history, past medical history, past social history, past surgical history and problem list. Problem list updated.  Objective:   Vitals:   06/09/18 1556  BP: 124/78  Pulse: 99  Weight: 225 lb (102.1 kg)    Fetal Status: Fetal Heart Rate (bpm): 156   Movement: Present     General:  Alert, oriented and cooperative. Patient is in no acute distress.  Skin: Skin is warm and dry. No rash noted.   Cardiovascular: Normal heart rate noted  Respiratory: Normal respiratory effort, no problems with respiration noted  Abdomen: Soft, gravid, appropriate for gestational age. Pain/Pressure: Present     Pelvic:  Cervical exam deferred        Extremities: Normal range of motion.  Edema: Trace  Mental Status: Normal mood and affect. Normal behavior. Normal judgment and thought content.   Urinalysis:      Assessment and Plan:  Pregnancy: G3P0020 at 5756w2d  1. Encounter for supervision of normal pregnancy, antepartum, unspecified gravidity Stable Tx for URI sx reviewed  Preterm labor symptoms and general obstetric precautions including but not limited to vaginal bleeding, contractions, leaking of fluid and fetal movement were reviewed in detail with the patient. Please refer to After Visit Summary for other counseling recommendations.  Return in about 2 weeks (around 06/23/2018) for OB visit.   Hermina StaggersErvin, Deddrick Saindon L, MD

## 2018-06-21 ENCOUNTER — Inpatient Hospital Stay (HOSPITAL_COMMUNITY)
Admission: AD | Admit: 2018-06-21 | Discharge: 2018-06-21 | Disposition: A | Discharge status: Home / Self Care | Payer: Medicaid Other | Attending: Obstetrics & Gynecology | Admitting: Obstetrics & Gynecology

## 2018-06-21 ENCOUNTER — Other Ambulatory Visit: Payer: Self-pay

## 2018-06-21 DIAGNOSIS — B3731 Acute candidiasis of vulva and vagina: Secondary | ICD-10-CM

## 2018-06-21 DIAGNOSIS — O98813 Other maternal infectious and parasitic diseases complicating pregnancy, third trimester: Secondary | ICD-10-CM | POA: Insufficient documentation

## 2018-06-21 DIAGNOSIS — B373 Candidiasis of vulva and vagina: Secondary | ICD-10-CM

## 2018-06-21 DIAGNOSIS — O26853 Spotting complicating pregnancy, third trimester: Secondary | ICD-10-CM

## 2018-06-21 DIAGNOSIS — O26893 Other specified pregnancy related conditions, third trimester: Secondary | ICD-10-CM | POA: Diagnosis not present

## 2018-06-21 DIAGNOSIS — N898 Other specified noninflammatory disorders of vagina: Secondary | ICD-10-CM | POA: Diagnosis not present

## 2018-06-21 DIAGNOSIS — Z3A31 31 weeks gestation of pregnancy: Secondary | ICD-10-CM | POA: Diagnosis not present

## 2018-06-21 LAB — WET PREP, GENITAL
Clue Cells Wet Prep HPF POC: NONE SEEN
Sperm: NONE SEEN
Trich, Wet Prep: NONE SEEN

## 2018-06-21 MED ORDER — TERCONAZOLE 0.4 % VA CREA: 1.0000 | TOPICAL_CREAM | Freq: Every day | VAGINAL | 0 refills | Status: DC

## 2018-06-21 MED ORDER — CYCLOBENZAPRINE HCL 10 MG PO TABS: 5.0000 mg | ORAL_TABLET | Freq: Every day | ORAL | 0 refills | Status: DC

## 2018-06-21 MED ORDER — CYCLOBENZAPRINE HCL 10 MG PO TABS: 10.0000 mg | ORAL_TABLET | Freq: Every day | ORAL | 0 refills | Status: DC

## 2018-06-21 NOTE — Progress Notes (Signed)
States that she is unable to collect a urine specimen for u/a at this time.

## 2018-06-21 NOTE — MAU Note (Signed)
Pt presents to MAU with complaints of vaginal bleeding when she wipes that started at work about 30 mins. Denies any pain, +FM

## 2018-06-21 NOTE — MAU Note (Signed)
Presents to MAU with dark red vaginal bleeding that she noticed when she wiped after urinating about an hour ago.

## 2018-06-21 NOTE — Discharge Instructions (Signed)

## 2018-06-21 NOTE — MAU Provider Note (Addendum)
History     CSN: [id number]  Arrival date and time: 06/21/18 1517   First Provider Initiated Contact with Patient 06/21/18 1605      Chief Complaint  Patient presents with  . Vaginal Bleeding   HPI   Ms.Carla Hammond is a 22 y.o. female G3P0020 @ [redacted]w[redacted]d here in MAU with complaints of dark red spotting that she noticed only when she wiped. She saw it first at 2:45 today. This is the first time she noticed the bleeding in pregnancy. No abdominal pain. No leaking of water. No recent intercourse.  OB History    Gravida  3   Para  0   Term      Preterm      AB  2   Living        SAB      TAB  2   Ectopic      Multiple      Live Births              Past Medical History:  Diagnosis Date  . Asthma     Past Surgical History:  Procedure Laterality Date  . INDUCED ABORTION      Family History  Problem Relation Age of Onset  . Hypertension Mother   . Stroke Maternal Grandfather   . Heart disease Maternal Grandfather     Social History   Tobacco Use  . Smoking status: Never Smoker  . Smokeless tobacco: Never Used  Substance Use Topics  . Alcohol use: Not Currently    Comment: occ when not preg  . Drug use: Never    Allergies: No Known Allergies  Medications Prior to Admission  Medication Sig Dispense Refill Last Dose  . acetaminophen (TYLENOL) 500 MG tablet Take 1,000 mg by mouth every 8 (eight) hours as needed for headache.   Taking  . albuterol (PROVENTIL HFA;VENTOLIN HFA) 108 (90 Base) MCG/ACT inhaler Inhale 2 puffs into the lungs every 6 (six) hours as needed for wheezing or shortness of breath.   Taking  . ondansetron (ZOFRAN ODT) 4 MG disintegrating tablet Take 1 tablet (4 mg total) by mouth every 8 (eight) hours as needed for nausea or vomiting. (Patient not taking: Reported on 05/26/2018) 30 tablet 1 Not Taking  . Prenatal Vit-Fe Fumarate-FA (PRENATAL MULTIVITAMIN) TABS tablet Take 1 tablet by mouth daily at 12 noon.   Taking   Results  for orders placed or performed during the hospital encounter of 06/21/18 (from the past 48 hour(s))  Wet prep, genital     Status: Abnormal   Collection Time: 06/21/18  4:15 PM  Result Value Ref Range   Yeast Wet Prep HPF POC PRESENT (A) NONE SEEN   Trich, Wet Prep NONE SEEN NONE SEEN   Clue Cells Wet Prep HPF POC NONE SEEN NONE SEEN   WBC, Wet Prep HPF POC MANY (A) NONE SEEN    Comment: MANY BACTERIA SEEN   Sperm NONE SEEN     Comment: Performed at [facility], [address], [place], [place] [address]   Review of Systems  Gastrointestinal: Negative for abdominal pain.  Genitourinary: Positive for vaginal bleeding and vaginal discharge.   Physical Exam   Blood pressure 129/65, pulse 93, temperature 98.2 F (36.8 C), resp. rate 18, height 5\' 3"  (1.6 m), weight 100.2 kg, last menstrual period 11/16/2017, SpO2 99 %, unknown if currently breastfeeding.  Patient Vitals for the past 24 hrs:  BP Temp Temp src Pulse Resp SpO2 Height Weight  06/21/18 1705 136/63 98.2 F (36.8 C) Oral 78 16 99 % - -  06/21/18 1652 136/63 - - 78 16 - - -  06/21/18 1603 129/65 - - 93 18 - - -  06/21/18 1555 - - - - - 99 % - -  06/21/18 1545 - - - - - 98 % - -  06/21/18 1526 (!) 145/71 98.2 F (36.8 C) - 90 18 - 5\' 3"  (1.6 m) 100.2 kg    Physical Exam  Constitutional: She is oriented to person, place, and time. She appears well-developed and well-nourished. No distress.  HENT:  Head: Normocephalic.  Eyes: Pupils are equal, round, and reactive to light.  GI: Soft. She exhibits no distension. There is no tenderness. There is no rebound and no guarding.  Genitourinary:  Genitourinary Comments: Vagina - Small amount of thick, white vaginal discharge, no odor  Cervix - No contact bleeding, no active bleeding  Bimanual exam: visually closed  GC/Chlam, wet prep done Chaperone present for exam.   Musculoskeletal: Normal range of motion.  Neurological: She is alert and oriented to person, place, and  time.  Skin: Skin is warm. She is not diaphoretic.  Psychiatric: Her behavior is normal.   Fetal Tracing: Baseline: 140 bpm Variability: Moderate  Accelerations: 15x15 Decelerations: None Toco: None  MAU Course  Procedures  none  MDM  Wet pre and Gc Exam with no bleeding on speculum. + yeast. Intake BP elevated, subsequent BP's normal   Assessment and Plan   A:  1. Vaginal discharge in pregnancy in third trimester   2. Spotting affecting pregnancy in third trimester   3. Yeast vaginitis     P:  Discharge home in stable condition Return to MAU if symptoms worsen Rx: Radene Ouerazol, Flexeril F/u with OB as scheduled  Duane Lopeasch, Carmela Piechowski I, NP 06/21/2018 5:17 PM

## 2018-06-22 LAB — GC/CHLAMYDIA PROBE AMP (~~LOC~~) NOT AT ARMC
CHLAMYDIA, DNA PROBE: NEGATIVE
NEISSERIA GONORRHEA: NEGATIVE

## 2018-06-23 ENCOUNTER — Ambulatory Visit (INDEPENDENT_AMBULATORY_CARE_PROVIDER_SITE_OTHER): Payer: Medicaid Other | Admitting: Obstetrics & Gynecology

## 2018-06-23 ENCOUNTER — Encounter: Payer: Self-pay | Admitting: Obstetrics & Gynecology

## 2018-06-23 DIAGNOSIS — Z3493 Encounter for supervision of normal pregnancy, unspecified, third trimester: Secondary | ICD-10-CM

## 2018-06-23 DIAGNOSIS — Z3490 Encounter for supervision of normal pregnancy, unspecified, unspecified trimester: Secondary | ICD-10-CM

## 2018-06-23 DIAGNOSIS — Z148 Genetic carrier of other disease: Secondary | ICD-10-CM | POA: Insufficient documentation

## 2018-06-23 NOTE — Progress Notes (Signed)
   PRENATAL VISIT NOTE  Subjective:  Carla Hammond is a 22 y.o. G3P0020 at 6411w2d being seen today for ongoing prenatal care.  She is currently monitored for the following issues for this low-risk pregnancy and has Encounter for supervision of normal pregnancy, unspecified, unspecified trimester and Genetic carrier status on their problem list.  Patient reports no complaints.  Contractions: Not present. Vag. Bleeding: None.  Movement: Present. Denies leaking of fluid.   The following portions of the patient's history were reviewed and updated as appropriate: allergies, current medications, past family history, past medical history, past social history, past surgical history and problem list. Problem list updated.  Objective:   Vitals:   06/23/18 0823  BP: 115/76  Pulse: 91  Weight: 100.7 kg    Fetal Status:     Movement: Present     General:  Alert, oriented and cooperative. Patient is in no acute distress.  Skin: Skin is warm and dry. No rash noted.   Cardiovascular: Normal heart rate noted  Respiratory: Normal respiratory effort, no problems with respiration noted  Abdomen: Soft, gravid, appropriate for gestational age.  Pain/Pressure: Present     Pelvic: Cervical exam deferred        Extremities: Normal range of motion.  Edema: Trace  Mental Status: Normal mood and affect. Normal behavior. Normal judgment and thought content.   Assessment and Plan:  Pregnancy: G3P0020 at 2111w2d  1. Encounter for supervision of normal pregnancy, antepartum, unspecified gravidity SMA carrier - AMB referral to maternal fetal medicine - US MFM OB FOLLOW UP; Future  2. 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 Please refer to After Visit Summary for other counseling recommendations.  Return in about 3 weeks  (around 07/14/2018).  Future Appointments  Date Time Provider Department Center  07/12/2018  9:00 AM [facility] US 3 [facility]  07/12/2018 10:00 AM [facility] GENETIC COUNSELING RM [facility]    Scheryl DarterJames , MD

## 2018-06-23 NOTE — Patient Instructions (Signed)

## 2018-06-23 NOTE — Progress Notes (Signed)
Pt is concerned about how much her baby weighs right now.

## 2018-07-12 ENCOUNTER — Ambulatory Visit (HOSPITAL_COMMUNITY)
Admission: RE | Admit: 2018-07-12 | Discharge: 2018-07-12 | Disposition: A | Discharge status: Home / Self Care | Payer: Medicaid Other | Source: Ambulatory Visit | Attending: Obstetrics & Gynecology | Admitting: Obstetrics & Gynecology

## 2018-07-12 ENCOUNTER — Ambulatory Visit (HOSPITAL_COMMUNITY): Payer: Self-pay

## 2018-07-12 ENCOUNTER — Encounter (HOSPITAL_COMMUNITY): Payer: Self-pay

## 2018-07-12 ENCOUNTER — Ambulatory Visit (HOSPITAL_COMMUNITY): Payer: Self-pay | Admitting: Obstetrics & Gynecology

## 2018-07-12 DIAGNOSIS — Z3A34 34 weeks gestation of pregnancy: Secondary | ICD-10-CM | POA: Diagnosis not present

## 2018-07-12 DIAGNOSIS — Z3490 Encounter for supervision of normal pregnancy, unspecified, unspecified trimester: Secondary | ICD-10-CM | POA: Diagnosis not present

## 2018-07-12 DIAGNOSIS — Z363 Encounter for antenatal screening for malformations: Secondary | ICD-10-CM

## 2018-07-12 DIAGNOSIS — O99212 Obesity complicating pregnancy, second trimester: Secondary | ICD-10-CM

## 2018-07-12 HISTORY — DX: Other complications of anesthesia, initial encounter: T88.59XA

## 2018-07-12 HISTORY — DX: Adverse effect of unspecified anesthetic, initial encounter: T41.45XA

## 2018-07-14 NOTE — L&D Delivery Note (Addendum)
Delivery Note At 2:05 PM a viable female was delivered via Vaginal, Spontaneous (Presentation: vertex; OA).  APGAR: 9, 9; weight: 6 lb 3.8 oz (2830 g).   Placenta status: delivered intact, spontaneously.  Cord: 3-vessel with the following complications: none.  Cord pH: N/A  Anesthesia: Epidural Episiotomy: None Lacerations: 2nd degree;Perineal Suture Repair: 3.0 vicryl Est. Blood Loss (mL): 247 mL  Mom to postpartum.  Baby to Couplet care / Skin to Skin.  Dollene Cleveland 08/08/2018, 3:00 PM   The patient was noted to be complete and pushing. Patient noted to have epidural anesthesia.   The patient was asked to push and the head delivered spontaneously in the OA position, over an intact perineum. A nuchal cord was checked and none was noted.   The anterior shoulder delivered easily and the posterior shoulder followed. The remainder of the infant was easily delivered and placed on mom's chest skin-to-skin where nursing personnel were in attendance. The infant was noted to have spontaneous cry and movement of all 4 extremities. The oropharynx and nasopharynx were bulb suctioned. After a 1-minute delay he cord was clamped x 2 and cut by the baby's grandfather.   The placenta delivered intact spontaneously. Pitocin was started IV to firm the uterus.   Examination of the cervix and vaginal vault did not reveal any lacerations. Examination of the perineum showed a small lacerations. The laceration was repaired with 3.0 vicryl.  A vaginal pack was then placed. The patient tolerated the procedure well.   All sponge and needle counts were correct. Luna Kitchens was present for the entire procedure.   I attest that I was present and gloved for the delivery and repair.   Luna Kitchens    Please schedule this patient for Postpartum visit in: 4 weeks with the following provider: MD. She also needs a BP check in three days; message has been sent to [facility] earlier today to schedule it.  For  C/S patients schedule nurse incision check in weeks 2 weeks: no High risk pregnancy complicated by: HTN that was recently diagnosed.  Delivery mode:  SVD Anticipated Birth Control:  IUD PP Procedures needed: none  Schedule Integrated BH visit: no

## 2018-07-16 ENCOUNTER — Ambulatory Visit (INDEPENDENT_AMBULATORY_CARE_PROVIDER_SITE_OTHER): Payer: Medicaid Other | Admitting: Obstetrics & Gynecology

## 2018-07-16 ENCOUNTER — Encounter: Payer: Self-pay | Admitting: Obstetrics & Gynecology

## 2018-07-16 VITALS — BP 124/81 | HR 94 | Wt 230.0 lb

## 2018-07-16 DIAGNOSIS — Z3490 Encounter for supervision of normal pregnancy, unspecified, unspecified trimester: Secondary | ICD-10-CM

## 2018-07-16 DIAGNOSIS — Z3493 Encounter for supervision of normal pregnancy, unspecified, third trimester: Secondary | ICD-10-CM

## 2018-07-16 NOTE — Patient Instructions (Signed)

## 2018-07-16 NOTE — Progress Notes (Signed)
   PRENATAL VISIT NOTE  Subjective:  Carla Hammond is a 23 y.o. G3P0020 at [redacted]w[redacted]d being seen today for ongoing prenatal care.  She is currently monitored for the following issues for this low-risk pregnancy and has Encounter for supervision of normal pregnancy, unspecified, unspecified trimester and Genetic carrier status on their problem list.  Patient reports pressure.  Contractions: Irritability. Vag. Bleeding: None.  Movement: Present. Denies leaking of fluid.   The following portions of the patient's history were reviewed and updated as appropriate: allergies, current medications, past family history, past medical history, past social history, past surgical history and problem list. Problem list updated.  Objective:   Vitals:   07/16/18 1031  BP: 124/81  Pulse: 94  Weight: 230 lb (104.3 kg)    Fetal Status: Fetal Heart Rate (bpm): 148 Fundal Height: 35 cm Movement: Present     General:  Alert, oriented and cooperative. Patient is in no acute distress.  Skin: Skin is warm and dry. No rash noted.   Cardiovascular: Normal heart rate noted  Respiratory: Normal respiratory effort, no problems with respiration noted  Abdomen: Soft, gravid, appropriate for gestational age.  Pain/Pressure: Present     Pelvic: Cervical exam deferred        Extremities: Normal range of motion.  Edema: Trace  Mental Status: Normal mood and affect. Normal behavior. Normal judgment and thought content.   Assessment and Plan:  Pregnancy: G3P0020 at [redacted]w[redacted]d  1. Encounter for supervision of normal pregnancy, antepartum, unspecified gravidity GBS NV  Preterm labor symptoms and general obstetric precautions including but not limited to vaginal bleeding, contractions, leaking of fluid and fetal movement were reviewed in detail with the patient. Please refer to After Visit Summary for other counseling recommendations.  Return in about 2 weeks (around 07/30/2018).  Future Appointments  Date Time Provider  Department Center  07/30/2018  8:15 AM Allie Bossier, MD [facility] None    Scheryl Darter, MD

## 2018-07-19 ENCOUNTER — Other Ambulatory Visit: Payer: Self-pay

## 2018-07-19 ENCOUNTER — Inpatient Hospital Stay (HOSPITAL_COMMUNITY)
Admission: AD | Admit: 2018-07-19 | Discharge: 2018-07-19 | Disposition: A | Discharge status: Home / Self Care | Payer: Medicaid Other | Source: Ambulatory Visit | Attending: Obstetrics and Gynecology | Admitting: Obstetrics and Gynecology

## 2018-07-19 ENCOUNTER — Encounter (HOSPITAL_COMMUNITY): Payer: Self-pay

## 2018-07-19 DIAGNOSIS — O9989 Other specified diseases and conditions complicating pregnancy, childbirth and the puerperium: Secondary | ICD-10-CM

## 2018-07-19 DIAGNOSIS — O479 False labor, unspecified: Secondary | ICD-10-CM

## 2018-07-19 DIAGNOSIS — O4703 False labor before 37 completed weeks of gestation, third trimester: Secondary | ICD-10-CM | POA: Diagnosis not present

## 2018-07-19 DIAGNOSIS — Z3A35 35 weeks gestation of pregnancy: Secondary | ICD-10-CM | POA: Diagnosis not present

## 2018-07-19 DIAGNOSIS — M549 Dorsalgia, unspecified: Secondary | ICD-10-CM | POA: Insufficient documentation

## 2018-07-19 DIAGNOSIS — O26893 Other specified pregnancy related conditions, third trimester: Secondary | ICD-10-CM | POA: Insufficient documentation

## 2018-07-19 DIAGNOSIS — O4700 False labor before 37 completed weeks of gestation, unspecified trimester: Secondary | ICD-10-CM | POA: Diagnosis present

## 2018-07-19 LAB — URINALYSIS, ROUTINE W REFLEX MICROSCOPIC
Bilirubin Urine: NEGATIVE
GLUCOSE, UA: NEGATIVE mg/dL
Hgb urine dipstick: NEGATIVE
KETONES UR: NEGATIVE mg/dL
LEUKOCYTES UA: NEGATIVE
Nitrite: NEGATIVE
PH: 6 (ref 5.0–8.0)
Protein, ur: NEGATIVE mg/dL
SPECIFIC GRAVITY, URINE: 1.008 (ref 1.005–1.030)

## 2018-07-19 NOTE — MAU Provider Note (Signed)
History     CSN: [id number]  Arrival date and time: 07/19/18 1918   First Provider Initiated Contact with Patient 07/19/18 2206      Chief Complaint  Patient presents with  . Contractions   HPI  Ms.  Carla Hammond is a 23 y.o. year old 683P0020 female at 5679w0d weeks gestation who presents to MAU reporting UC's every 15-20 mins while she ws at work today (~ 1300 during lunch break). 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 She denies VB or LOF. She reports good (+) FM.   Past Medical History:  Diagnosis Date  . Asthma   . Complication of anesthesia     Past Surgical History:  Procedure Laterality Date  . INDUCED ABORTION      Family History  Problem Relation Age of Onset  . Hypertension Mother   . Stroke Maternal Grandfather   . Heart disease Maternal Grandfather     Social History   Tobacco Use  . Smoking status: Never Smoker  . Smokeless tobacco: Never Used  Substance Use Topics  . Alcohol use: Not Currently    Comment: occ when not preg  . Drug use: Never    Allergies: No Known Allergies  Medications Prior to Admission  Medication Sig Dispense Refill Last Dose  . acetaminophen (TYLENOL) 500 MG tablet Take 1,000 mg by mouth every 8 (eight) hours as needed for headache.   Past Week at Unknown time  . cyclobenzaprine (FLEXERIL) 10 MG tablet Take 0.5-1 tablets (5-10 mg total) by mouth at bedtime. 20 tablet 0 07/18/2018 at Unknown time  . Prenatal Vit-Fe Fumarate-FA (PRENATAL MULTIVITAMIN) TABS tablet Take 1 tablet by mouth daily at 12 noon.   07/19/2018 at Unknown time  . albuterol (PROVENTIL HFA;VENTOLIN HFA) 108 (90 Base) MCG/ACT inhaler Inhale 2 puffs into the lungs every 6 (six) hours as needed for wheezing or shortness of breath.   More than a month at Unknown time  . ondansetron (ZOFRAN ODT) 4 MG disintegrating tablet Take 1 tablet (4 mg total) by mouth every 8 (eight) hours as needed for nausea or vomiting. (Patient not taking: Reported on  05/26/2018) 30 tablet 1 Not Taking  . terconazole (TERAZOL 7) 0.4 % vaginal cream Place 1 applicator vaginally at bedtime. (Patient not taking: Reported on 07/12/2018) 45 g 0 Not Taking    Review of Systems  Constitutional: Negative.   HENT: Negative.   Eyes: Negative.   Respiratory: Negative.   Cardiovascular: Negative.   Gastrointestinal: Negative.   Endocrine: Negative.   Genitourinary: Negative.   Musculoskeletal: Positive for back pain.  Skin: Negative.   Allergic/Immunologic: Negative.   Neurological: Negative.   Hematological: Negative.   Psychiatric/Behavioral: Negative.    Physical Exam   Blood pressure 131/74, pulse 89, temperature 98.2 F (36.8 C), temperature source Oral, resp. rate 18, weight 105.9 kg, last menstrual period 11/16/2017.  Physical Exam  Nursing note and vitals reviewed. Constitutional: She is oriented to person, place, and time. She appears well-developed and well-nourished.  HENT:  Head: Normocephalic and atraumatic.  Eyes: Pupils are equal, round, and reactive to light.  Neck: Normal range of motion.  Cardiovascular: Normal rate and regular rhythm.  Respiratory: Effort normal.  GI: Soft.  Genitourinary:    Genitourinary Comments: Dilation: Closed Effacement (%): Thick Cervical Position: Posterior Station: Ballotable Presentation: Vertex Exam by: Carla Hammond, CNM   Musculoskeletal: Normal range of motion.  Neurological: She is alert and oriented to person, place, and time.  Skin:  Skin is warm and dry.  Psychiatric: She has a normal mood and affect. Her behavior is normal. Judgment and thought content normal.    MAU Course  Procedures  MDM [facility] NST - FHR: 145 bpm / moderate variability / accels present / decels absent / TOCO: irregular   Results for orders placed or performed during the hospital encounter of 07/19/18 (from the past 24 hour(s))  Urinalysis, Routine w reflex microscopic     Status: None   Collection Time: 07/19/18  7:35  PM  Result Value Ref Range   Color, Urine YELLOW YELLOW   APPearance CLEAR CLEAR   Specific Gravity, Urine 1.008 1.005 - 1.030   pH 6.0 5.0 - 8.0   Glucose, UA NEGATIVE NEGATIVE mg/dL   Hgb urine dipstick NEGATIVE NEGATIVE   Bilirubin Urine NEGATIVE NEGATIVE   Ketones, ur NEGATIVE NEGATIVE mg/dL   Protein, ur NEGATIVE NEGATIVE mg/dL   Nitrite NEGATIVE NEGATIVE   Leukocytes, UA NEGATIVE NEGATIVE    Assessment and Plan  Back pain affecting pregnancy in third trimester  - Advised to drink at least 6-8 bottles of water while working.  - Ok to take Tylenol 1000 mg every 6 hrs as needed for pain.  - Soak in a warm tub of water to relieve pain.  - If none of that helps, call [facility] for >6 painful contractions in 1 hour. - Discharge home - Keep scheduled appt on 07/30/2018 - Patient verbalized an understanding of the plan of care and agrees.     Raelyn Moraolitta Kolton Kienle, MSN, CNM 07/19/2018, 10:06 PM

## 2018-07-19 NOTE — MAU Note (Signed)
Pt reports to MAU c/o ctxs that started around 1300. Pt states her ctxs were every 15 mins but now are further apart. Pt reports +FM. No bleeding or LOF.

## 2018-07-19 NOTE — Discharge Instructions (Signed)
Make sure you are drinking at least 6-8 bottles of water while working. You can take Tylenol 1000 mg every 6 hrs as needed for pain. Soak in a warm tub of water to relieve pain. If none of that helps, call [facility] for >6 painful contractions in 1 hour.

## 2018-07-30 ENCOUNTER — Other Ambulatory Visit (HOSPITAL_COMMUNITY)
Admission: RE | Admit: 2018-07-30 | Discharge: 2018-07-30 | Disposition: A | Discharge status: Home / Self Care | Payer: Medicaid Other | Source: Ambulatory Visit | Attending: Obstetrics & Gynecology | Admitting: Obstetrics & Gynecology

## 2018-07-30 ENCOUNTER — Ambulatory Visit (INDEPENDENT_AMBULATORY_CARE_PROVIDER_SITE_OTHER): Payer: Medicaid Other | Admitting: Obstetrics & Gynecology

## 2018-07-30 DIAGNOSIS — Z3490 Encounter for supervision of normal pregnancy, unspecified, unspecified trimester: Secondary | ICD-10-CM

## 2018-07-30 NOTE — Progress Notes (Signed)
   PRENATAL VISIT NOTE  Subjective:  Fredrick Mcbryde is a 23 y.o. G3P0020 at [redacted]w[redacted]d being seen today for ongoing prenatal care.  She is currently monitored for the following issues for this low-risk pregnancy and has Encounter for supervision of normal pregnancy, unspecified, unspecified trimester; Genetic carrier status; and Preterm contractions on their problem list.  Patient reports no complaints.  Contractions: Irregular. Vag. Bleeding: None.  Movement: Present. Denies leaking of fluid.   The following portions of the patient's history were reviewed and updated as appropriate: allergies, current medications, past family history, past medical history, past social history, past surgical history and problem list. Problem list updated.  Objective:   Vitals:   07/30/18 0823  BP: 130/83  Pulse: 94  Weight: 232 lb (105.2 kg)    Fetal Status: Fetal Heart Rate (bpm): 168   Movement: Present     General:  Alert, oriented and cooperative. Patient is in no acute distress.  Skin: Skin is warm and dry. No rash noted.   Cardiovascular: Normal heart rate noted  Respiratory: Normal respiratory effort, no problems with respiration noted  Abdomen: Soft, gravid, appropriate for gestational age.  Pain/Pressure: Present     Pelvic: Cervical exam performed        Extremities: Normal range of motion.  Edema: Trace  Mental Status: Normal mood and affect. Normal behavior. Normal judgment and thought content.   Assessment and Plan:  Pregnancy: G3P0020 at [redacted]w[redacted]d  1. Encounter for supervision of normal pregnancy, antepartum, unspecified gravidity - cervical cultures today  Preterm labor symptoms and general obstetric precautions including but not limited to vaginal bleeding, contractions, leaking of fluid and fetal movement were reviewed in detail with the patient. Please refer to After Visit Summary for other counseling recommendations.  Return in about 1 week (around 08/06/2018).  No future  appointments.  Allie Bossier, MD

## 2018-07-30 NOTE — Progress Notes (Signed)
Pt is here for ROB. [redacted]w[redacted]d.  

## 2018-08-01 LAB — STREP GP B NAA: Strep Gp B NAA: POSITIVE — AB

## 2018-08-02 ENCOUNTER — Encounter: Payer: Self-pay | Admitting: Obstetrics & Gynecology

## 2018-08-02 DIAGNOSIS — O99820 Streptococcus B carrier state complicating pregnancy: Secondary | ICD-10-CM | POA: Insufficient documentation

## 2018-08-02 LAB — CERVICOVAGINAL ANCILLARY ONLY
CHLAMYDIA, DNA PROBE: NEGATIVE
NEISSERIA GONORRHEA: NEGATIVE

## 2018-08-03 NOTE — Patient Instructions (Signed)
Braxton Hicks Contractions Contractions of the uterus can occur throughout pregnancy, but they are not always a sign that you are in labor. You may have practice contractions called Braxton Hicks contractions. These false labor contractions are sometimes confused with true labor. What are Braxton Hicks contractions? Braxton Hicks contractions are tightening movements that occur in the muscles of the uterus before labor. Unlike true labor contractions, these contractions do not result in opening (dilation) and thinning of the cervix. Toward the end of pregnancy (32-34 weeks), Braxton Hicks contractions can happen more often and may become stronger. These contractions are sometimes difficult to tell apart from true labor because they can be very uncomfortable. You should not feel embarrassed if you go to the hospital with false labor. Sometimes, the only way to tell if you are in true labor is for your health care provider to look for changes in the cervix. The health care provider will do a physical exam and may monitor your contractions. If you are not in true labor, the exam should show that your cervix is not dilating and your water has not broken. If there are no other health problems associated with your pregnancy, it is completely safe for you to be sent home with false labor. You may continue to have Braxton Hicks contractions until you go into true labor. How to tell the difference between true labor and false labor True labor  Contractions last 30-70 seconds.  Contractions become very regular.  Discomfort is usually felt in the top of the uterus, and it spreads to the lower abdomen and low back.  Contractions do not go away with walking.  Contractions usually become more intense and increase in frequency.  The cervix dilates and gets thinner. False labor  Contractions are usually shorter and not as strong as true labor contractions.  Contractions are usually irregular.  Contractions  are often felt in the front of the lower abdomen and in the groin.  Contractions may go away when you walk around or change positions while lying down.  Contractions get weaker and are shorter-lasting as time goes on.  The cervix usually does not dilate or become thin. Follow these instructions at home:   Take over-the-counter and prescription medicines only as told by your health care provider.  Keep up with your usual exercises and follow other instructions from your health care provider.  Eat and drink lightly if you think you are going into labor.  If Braxton Hicks contractions are making you uncomfortable: ? Change your position from lying down or resting to walking, or change from walking to resting. ? Sit and rest in a tub of warm water. ? Drink enough fluid to keep your urine pale yellow. Dehydration may cause these contractions. ? Do slow and deep breathing several times an hour.  Keep all follow-up prenatal visits as told by your health care provider. This is important. Contact a health care provider if:  You have a fever.  You have continuous pain in your abdomen. Get help right away if:  Your contractions become stronger, more regular, and closer together.  You have fluid leaking or gushing from your vagina.  You pass blood-tinged mucus (bloody show).  You have bleeding from your vagina.  You have low back pain that you never had before.  You feel your baby's head pushing down and causing pelvic pressure.  Your baby is not moving inside you as much as it used to. Summary  Contractions that occur before labor are   called Braxton Hicks contractions, false labor, or practice contractions.  Braxton Hicks contractions are usually shorter, weaker, farther apart, and less regular than true labor contractions. True labor contractions usually become progressively stronger and regular, and they become more frequent.  Manage discomfort from Braxton Hicks contractions  by changing position, resting in a warm bath, drinking plenty of water, or practicing deep breathing. This information is not intended to replace advice given to you by your health care provider. Make sure you discuss any questions you have with your health care provider. Document Released: 11/13/2016 Document Revised: 04/14/2017 Document Reviewed: 11/13/2016 Elsevier Interactive Patient Education  2019 [employer]  

## 2018-08-06 ENCOUNTER — Encounter (HOSPITAL_COMMUNITY): Payer: Self-pay | Admitting: *Deleted

## 2018-08-06 ENCOUNTER — Ambulatory Visit (INDEPENDENT_AMBULATORY_CARE_PROVIDER_SITE_OTHER): Payer: Medicaid Other | Admitting: Advanced Practice Midwife

## 2018-08-06 ENCOUNTER — Inpatient Hospital Stay (EMERGENCY_DEPARTMENT_HOSPITAL)
Admission: AD | Admit: 2018-08-06 | Discharge: 2018-08-06 | Disposition: A | Discharge status: Home / Self Care | Payer: Medicaid Other | Source: Home / Self Care | Attending: Obstetrics and Gynecology | Admitting: Obstetrics and Gynecology

## 2018-08-06 ENCOUNTER — Other Ambulatory Visit: Payer: Self-pay

## 2018-08-06 VITALS — BP 150/89 | HR 96 | Wt 238.0 lb

## 2018-08-06 DIAGNOSIS — O133 Gestational [pregnancy-induced] hypertension without significant proteinuria, third trimester: Secondary | ICD-10-CM

## 2018-08-06 DIAGNOSIS — O99820 Streptococcus B carrier state complicating pregnancy: Secondary | ICD-10-CM

## 2018-08-06 DIAGNOSIS — R030 Elevated blood-pressure reading, without diagnosis of hypertension: Secondary | ICD-10-CM

## 2018-08-06 DIAGNOSIS — O163 Unspecified maternal hypertension, third trimester: Secondary | ICD-10-CM

## 2018-08-06 DIAGNOSIS — O26893 Other specified pregnancy related conditions, third trimester: Secondary | ICD-10-CM | POA: Insufficient documentation

## 2018-08-06 DIAGNOSIS — Z3480 Encounter for supervision of other normal pregnancy, unspecified trimester: Secondary | ICD-10-CM

## 2018-08-06 DIAGNOSIS — Z3483 Encounter for supervision of other normal pregnancy, third trimester: Secondary | ICD-10-CM

## 2018-08-06 DIAGNOSIS — Z3A37 37 weeks gestation of pregnancy: Secondary | ICD-10-CM | POA: Insufficient documentation

## 2018-08-06 DIAGNOSIS — Z148 Genetic carrier of other disease: Secondary | ICD-10-CM

## 2018-08-06 LAB — CBC
HEMATOCRIT: 36.8 % (ref 36.0–46.0)
HEMOGLOBIN: 12.2 g/dL (ref 12.0–15.0)
MCH: 30.5 pg (ref 26.0–34.0)
MCHC: 33.2 g/dL (ref 30.0–36.0)
MCV: 92 fL (ref 80.0–100.0)
Platelets: 207 10*3/uL (ref 150–400)
RBC: 4 MIL/uL (ref 3.87–5.11)
RDW: 13.3 % (ref 11.5–15.5)
WBC: 9.4 10*3/uL (ref 4.0–10.5)
nRBC: 0 % (ref 0.0–0.2)

## 2018-08-06 LAB — COMPREHENSIVE METABOLIC PANEL
ALK PHOS: 244 U/L — AB (ref 38–126)
ALT: 12 U/L (ref 0–44)
ANION GAP: 8 (ref 5–15)
AST: 19 U/L (ref 15–41)
Albumin: 3.1 g/dL — ABNORMAL LOW (ref 3.5–5.0)
BILIRUBIN TOTAL: 0.4 mg/dL (ref 0.3–1.2)
BUN: 9 mg/dL (ref 6–20)
CALCIUM: 8.9 mg/dL (ref 8.9–10.3)
CO2: 21 mmol/L — AB (ref 22–32)
Chloride: 107 mmol/L (ref 98–111)
Creatinine, Ser: 0.64 mg/dL (ref 0.44–1.00)
GFR calc non Af Amer: >60 mL/min (ref >60)
Glucose, Bld: 99 mg/dL (ref 70–99)
Potassium: 3.5 mmol/L (ref 3.5–5.1)
Sodium: 136 mmol/L (ref 135–145)
TOTAL PROTEIN: 7.3 g/dL (ref 6.5–8.1)

## 2018-08-06 LAB — URINALYSIS, ROUTINE W REFLEX MICROSCOPIC
BILIRUBIN URINE: NEGATIVE
GLUCOSE, UA: NEGATIVE mg/dL
Hgb urine dipstick: NEGATIVE
KETONES UR: NEGATIVE mg/dL
NITRITE: NEGATIVE
PH: 6 (ref 5.0–8.0)
Protein, ur: 100 mg/dL — AB
Specific Gravity, Urine: 1.028 (ref 1.005–1.030)

## 2018-08-06 LAB — PROTEIN / CREATININE RATIO, URINE
Creatinine, Urine: 352 mg/dL
Protein Creatinine Ratio: 0.22 mg/mg{Cre} — ABNORMAL HIGH (ref 0.00–0.15)
Total Protein, Urine: 78 mg/dL

## 2018-08-06 MED ORDER — METOCLOPRAMIDE HCL 10 MG PO TABS
10.0000 mg | ORAL_TABLET | Freq: Once | ORAL | Status: AC
  Administered 2018-08-06: 10 mg via ORAL
  Filled 2018-08-06: qty 1

## 2018-08-06 MED ORDER — ACETAMINOPHEN 500 MG PO TABS
1000.0000 mg | ORAL_TABLET | Freq: Once | ORAL | Status: AC
  Administered 2018-08-06: 1000 mg via ORAL
  Filled 2018-08-06: qty 2

## 2018-08-06 NOTE — Progress Notes (Signed)
Pt is here for ROB. [redacted]w[redacted]d.  

## 2018-08-06 NOTE — MAU Provider Note (Signed)
History     CSN: [id number]  Arrival date and time: 08/06/18 [id number]   First Provider Initiated Contact with Patient 08/06/18 [phone]      Chief Complaint  Patient presents with  . Hypertension   HPI Carla Hammond is a 23 y.o. G3P0020 at 6431w4d who [facility] of gestational hypertension and headache, new onset today. She endorses right frontal headache 2-3/10. She denies RUQ pain, visual disturbances,SOB, vaginal bleeding, leaking of fluid, decreased fetal movement, fever, falls, or recent illness.   OB History    Gravida  3   Para  0   Term      Preterm      AB  2   Living        SAB      TAB  2   Ectopic      Multiple      Live Births              Past Medical History:  Diagnosis Date  . Asthma   . Complication of anesthesia     Past Surgical History:  Procedure Laterality Date  . INDUCED ABORTION      Family History  Problem Relation Age of Onset  . Hypertension Mother   . Stroke Maternal Grandfather   . Heart disease Maternal Grandfather     Social History   Tobacco Use  . Smoking status: Never Smoker  . Smokeless tobacco: Never Used  Substance Use Topics  . Alcohol use: Not Currently    Comment: occ when not preg  . Drug use: Never    Allergies: No Known Allergies  Medications Prior to Admission  Medication Sig Dispense Refill Last Dose  . acetaminophen (TYLENOL) 500 MG tablet Take 1,000 mg by mouth every 8 (eight) hours as needed for headache.   Taking  . albuterol (PROVENTIL HFA;VENTOLIN HFA) 108 (90 Base) MCG/ACT inhaler Inhale 2 puffs into the lungs every 6 (six) hours as needed for wheezing or shortness of breath.   Taking  . cyclobenzaprine (FLEXERIL) 10 MG tablet Take 0.5-1 tablets (5-10 mg total) by mouth at bedtime. 20 tablet 0 Taking  . ondansetron (ZOFRAN ODT) 4 MG disintegrating tablet Take 1 tablet (4 mg total) by mouth every 8 (eight) hours as needed for nausea or vomiting. (Patient not  taking: Reported on 08/06/2018) 30 tablet 1 Not Taking  . Prenatal Vit-Fe Fumarate-FA (PRENATAL MULTIVITAMIN) TABS tablet Take 1 tablet by mouth daily at 12 noon.   Taking  . terconazole (TERAZOL 7) 0.4 % vaginal cream Place 1 applicator vaginally at bedtime. (Patient not taking: Reported on 07/12/2018) 45 g 0 Not Taking    Review of Systems  Constitutional: Negative for fever.  Respiratory: Negative for shortness of breath.   Gastrointestinal: Negative for abdominal pain, nausea and vomiting.  Genitourinary: Negative for vaginal bleeding, vaginal discharge and vaginal pain.  Musculoskeletal: Negative for back pain.  Neurological: Positive for headaches. Negative for seizures and weakness.  All other systems reviewed and are negative.  Physical Exam   Blood pressure (!) 152/94, pulse 97, temperature 97.8 F (36.6 C), resp. rate 16, last menstrual period 11/16/2017, SpO2 99 %, unknown if currently breastfeeding.  Physical Exam  Nursing note and vitals reviewed. Constitutional: She is oriented to person, place, and time. She appears well-developed and well-nourished.  Cardiovascular: Normal rate and normal heart sounds.  Respiratory: Effort normal and breath sounds normal. No respiratory distress. She has no wheezes. She has no rales. She  exhibits no tenderness.  GI: Soft. She exhibits no distension. There is no abdominal tenderness. There is no rebound, no guarding and no CVA tenderness.  Gravid  Neurological: She is alert and oriented to person, place, and time. No cranial nerve deficit.  Skin: Skin is warm and dry.  Psychiatric: She has a normal mood and affect. Her behavior is normal. Judgment and thought content normal.    MAU Course/MDM   --Elevated pressure new onset in clinic at 0830 today --Headache resolved with Tylenol and Reglan given in MAU --No severe range BPs --Reactive fetal tracing: baseline 140, moderate variability, positive accels, no decels --Toco: occasional  mild contractions not felt by patient  Patient Vitals for the past 24 hrs:  BP Temp Pulse Resp SpO2  08/06/18 1150 132/90 - 86 16 -  08/06/18 1120 - - - - 99 %  08/06/18 1116 140/88 - 95 - -  08/06/18 1115 - - - - 99 %  08/06/18 1110 - - - - 100 %  08/06/18 1001 130/82 - 88 - -  08/06/18 0950 139/84 - 94 - -  08/06/18 0931 (!) 147/95 - 96 - -  08/06/18 0925 (!) 152/94 97.8 F (36.6 C) 97 16 99 %    Results for orders placed or performed during the hospital encounter of 08/06/18 (from the past 24 hour(s))  CBC     Status: None   Collection Time: 08/06/18  9:38 AM  Result Value Ref Range   WBC 9.4 4.0 - 10.5 K/uL   RBC 4.00 3.87 - 5.11 MIL/uL   Hemoglobin 12.2 12.0 - 15.0 g/dL   HCT 16.136.8 09.636.0 - 04.546.0 %   MCV 92.0 80.0 - 100.0 fL   MCH 30.5 26.0 - 34.0 pg   MCHC 33.2 30.0 - 36.0 g/dL   RDW 40.913.3 81.111.5 - 91.415.5 %   Platelets 207 150 - 400 K/uL   nRBC 0.0 0.0 - 0.2 %  Comprehensive metabolic panel     Status: Abnormal   Collection Time: 08/06/18  9:38 AM  Result Value Ref Range   Sodium 136 135 - 145 mmol/L   Potassium 3.5 3.5 - 5.1 mmol/L   Chloride 107 98 - 111 mmol/L   CO2 21 (L) 22 - 32 mmol/L   Glucose, Bld 99 70 - 99 mg/dL   BUN 9 6 - 20 mg/dL   Creatinine, Ser 7.820.64 0.44 - 1.00 mg/dL   Calcium 8.9 8.9 - 95.610.3 mg/dL   Total Protein 7.3 6.5 - 8.1 g/dL   Albumin 3.1 (L) 3.5 - 5.0 g/dL   AST 19 15 - 41 U/L   ALT 12 0 - 44 U/L   Alkaline Phosphatase 244 (H) 38 - 126 U/L   Total Bilirubin 0.4 0.3 - 1.2 mg/dL   GFR calc non Af Amer >60 >60 mL/min   GFR calc Af Amer >60 >60 mL/min   Anion gap 8 5 - 15  Urinalysis, Routine w reflex microscopic     Status: Abnormal   Collection Time: 08/06/18  9:51 AM  Result Value Ref Range   Color, Urine AMBER (A) YELLOW   APPearance CLOUDY (A) CLEAR   Specific Gravity, Urine 1.028 1.005 - 1.030   pH 6.0 5.0 - 8.0   Glucose, UA NEGATIVE NEGATIVE mg/dL   Hgb urine dipstick NEGATIVE NEGATIVE   Bilirubin Urine NEGATIVE NEGATIVE   Ketones,  ur NEGATIVE NEGATIVE mg/dL   Protein, ur 213100 (A) NEGATIVE mg/dL   Nitrite NEGATIVE NEGATIVE  Leukocytes, UA TRACE (A) NEGATIVE   RBC / HPF 0-5 0 - 5 RBC/hpf   WBC, UA 6-10 0 - 5 WBC/hpf   Bacteria, UA RARE (A) NONE SEEN   Squamous Epithelial / LPF 11-20 0 - 5   Mucus PRESENT   Protein / creatinine ratio, urine     Status: Abnormal   Collection Time: 08/06/18  9:51 AM  Result Value Ref Range   Creatinine, Urine 352.00 mg/dL   Total Protein, Urine 78 mg/dL   Protein Creatinine Ratio 0.22 (H) 0.00 - 0.15 mg/mg[Cre]     Assessment and Plan  --23 y.o. G3P0020 at [redacted]w[redacted]d  --New onset elevated BPs, normal PEC labs --Discharge home in stable condition with strict Preeclampsia precautions  F/U: Patient to have BP check in MAU tomorrow morning. She is aware that if her BP is elevate she may be admitted for IOL  Management discussed with Dr. Alysia Penna who agrees with my plan of care.  Calvert Cantor, CNM 08/06/2018, 12:33 PM

## 2018-08-06 NOTE — Discharge Instructions (Signed)
Hypertension During Pregnancy ° °Hypertension, commonly called high blood pressure, is when the force of blood pumping through your arteries is too strong. Arteries are blood vessels that carry blood from the heart throughout the body. Hypertension during pregnancy can cause problems for you and your baby. Your baby may be born early (prematurely) or may not weigh as much as he or she should at birth. Very bad cases of hypertension during pregnancy can be life-threatening. °Different types of hypertension can occur during pregnancy. These include: °· Chronic hypertension. This happens when: °? You have hypertension before pregnancy and it continues during pregnancy. °? You develop hypertension before you are [redacted] weeks pregnant, and it continues during pregnancy. °· Gestational hypertension. This is hypertension that develops after the 20th week of pregnancy. °· Preeclampsia, also called toxemia of pregnancy. This is a very serious type of hypertension that develops during pregnancy. It can be very dangerous for you and your baby. °? In rare cases, you may develop preeclampsia after giving birth (postpartum preeclampsia). This usually occurs within 48 hours after childbirth but may occur up to 6 weeks after giving birth. °Gestational hypertension and preeclampsia usually go away within 6 weeks after your baby is born. Women who have hypertension during pregnancy have a greater chance of developing hypertension later in life or during future pregnancies. °What are the causes? °The exact cause of hypertension during pregnancy is not known. °What increases the risk? °There are certain factors that make it more likely for you to develop hypertension during pregnancy. These include: °· Having hypertension during a previous pregnancy or prior to pregnancy. °· Being overweight. °· Being age 35 or older. °· Being pregnant for the first time. °· Being pregnant with more than one baby. °· Becoming pregnant using fertilization  methods such as IVF (in vitro fertilization). °· Having diabetes, kidney problems, or systemic lupus erythematosus. °· Having a family history of hypertension. °What are the signs or symptoms? °Chronic hypertension and gestational hypertension rarely cause symptoms. Preeclampsia causes symptoms, which may include: °· Increased protein in your urine. Your health care provider will check for this at every visit before you give birth (prenatal visit). °· Severe headaches. °· Sudden weight gain. °· Swelling of the hands, face, legs, and feet. °· Nausea and vomiting. °· Vision problems, such as blurred or double vision. °· Numbness in the face, arms, legs, and feet. °· Dizziness. °· Slurred speech. °· Sensitivity to bright lights. °· Abdominal pain. °· Convulsions or seizures. °How is this diagnosed? °You may be diagnosed with hypertension during a routine prenatal exam. At each prenatal visit, you may: °· Have a urine test to check for high amounts of protein in your urine. °· Have your blood pressure checked. A blood pressure reading is given as two numbers, such as "120 over 80" (or 120/80). The first ("top") number is a measure of the pressure in your arteries when your heart beats (systolic pressure). The second ("bottom") number is a measure of the pressure in your arteries as your heart relaxes between beats (diastolic pressure). Blood pressure is measured in a unit called mm Hg. For most women, a normal blood pressure reading is: °? Systolic: below 120. °? Diastolic: below 80. °The type of hypertension that you are diagnosed with depends on your test results and when your symptoms developed. °· Chronic hypertension is usually diagnosed before 20 weeks of pregnancy. °· Gestational hypertension is usually diagnosed after 20 weeks of pregnancy. °· Hypertension with high amounts of protein in   the urine is diagnosed as preeclampsia. °· Blood pressure measurements that stay above 160 systolic, or above 110 diastolic,  are signs of severe preeclampsia. °How is this treated? °Treatment for hypertension during pregnancy varies depending on the type of hypertension you have and how serious it is. °· If you take medicines called ACE inhibitors to treat chronic hypertension, you may need to switch medicines. ACE inhibitors should not be taken during pregnancy. °· If you have gestational hypertension, you may need to take blood pressure medicine. °· If you are at risk for preeclampsia, your health care provider may recommend that you take a low-dose aspirin during your pregnancy. °· If you have severe preeclampsia, you may need to be hospitalized so you and your baby can be monitored closely. You may also need to take medicine (magnesium sulfate) to prevent seizures and to lower blood pressure. This medicine may be given as an injection or through an IV. °· In some cases, if your condition gets worse, you may need to deliver your baby early. °Follow these instructions at home: °Eating and drinking ° °· Drink enough fluid to keep your urine pale yellow. °· Avoid caffeine. °Lifestyle °· Do not use any products that contain nicotine or tobacco, such as cigarettes and e-cigarettes. If you need help quitting, ask your health care provider. °· Do not use alcohol or drugs. °· Avoid stress as much as possible. Rest and get plenty of sleep. °General instructions °· Take over-the-counter and prescription medicines only as told by your health care provider. °· While lying down, lie on your left side. This keeps pressure off your major blood vessels. °· While sitting or lying down, raise (elevate) your feet. Try putting some pillows under your lower legs. °· Exercise regularly. Ask your health care provider what kinds of exercise are best for you. °· Keep all prenatal and follow-up visits as told by your health care provider. This is important. °Contact a health care provider if: °· You have symptoms that your health care provider told you may  require more treatment or monitoring, such as: °? Nausea or vomiting. °? Headache. °Get help right away if you have: °· Severe abdominal pain that does not get better with treatment. °· A severe headache that does not get better. °· Vomiting that does not get better. °· Sudden, rapid weight gain. °· Sudden swelling in your hands, ankles, or face. °· Vaginal bleeding. °· Blood in your urine. °· Fewer movements from your baby than usual. °· Blurred or double vision. °· Muscle twitching or sudden muscle tightening (spasms). °· Shortness of breath. °· Blue fingernails or lips. °Summary °· Hypertension, commonly called high blood pressure, is when the force of blood pumping through your arteries is too strong. °· Hypertension during pregnancy can cause problems for you and your baby. °· Treatment for hypertension during pregnancy varies depending on the type of hypertension you have and how serious it is. °· Get help right away if you have symptoms that your health care provider told you to watch for. °This information is not intended to replace advice given to you by your health care provider. Make sure you discuss any questions you have with your health care provider. °Document Released: 03/18/2011 Document Revised: 06/16/2017 Document Reviewed: 12/14/2015 °Elsevier Interactive Patient Education © 2019 [employer] ° °

## 2018-08-06 NOTE — Progress Notes (Signed)
   PRENATAL VISIT NOTE  Subjective:  Carla Hammond is a 23 y.o. G3P0020 at [redacted]w[redacted]d being seen today for ongoing prenatal care.  She is currently monitored for the following issues for this low-risk pregnancy and has Encounter for supervision of normal pregnancy, unspecified, unspecified trimester; Genetic carrier status; Preterm contractions; and GBS (group B Streptococcus carrier), +RV culture, currently pregnant on their problem list.  Patient reports moderate headache since yesterday. Hasn't taken any meds to Tx it. Denies vision, chnages or epigastric pain. irreg UC's . Marland Kitchen  Contractions: Irregular. Vag. Bleeding: None.  Movement: Present. Denies leaking of fluid.   The following portions of the patient's history were reviewed and updated as appropriate: allergies, current medications, past family history, past medical history, past social history, past surgical history and problem list. Problem list updated.  Objective:   Vitals:   08/06/18 0827 08/06/18 0828  BP: (!) 148/91 (!) 150/89  Pulse: 96 96  Weight: 238 lb (108 kg)     Fetal Status: Fetal Heart Rate (bpm): 150 Fundal Height: 38 cm Movement: Present  Presentation: Vertex  General:  Alert, oriented and cooperative. Patient is in no acute distress.  Skin: Skin is warm and dry. No rash noted.   Cardiovascular: Normal heart rate noted  Respiratory: Normal respiratory effort, no problems with respiration noted  Abdomen: Soft, gravid, appropriate for gestational age.  Pain/Pressure: Present     Pelvic: Cervical exam performed Dilation: 1.5 Effacement (%): 50 Station: -3  Extremities: Normal range of motion.  Edema: Trace  Mental Status: Normal mood and affect. Normal behavior. Normal judgment and thought content.   Assessment and Plan:  Pregnancy: G3P0020 at [redacted]w[redacted]d  1. Supervision of other normal pregnancy, antepartum   2. GBS (group B Streptococcus carrier), +RV culture, currently pregnant - PCN labor  3. Genetic carrier  status - Declined FOB testing  4. HTN in pregnancy. No Hx HTN - To MAU for Pre-E work-up - Informed that it may be recommended that she stay for IOL, otherwise needs BP check 08/09/18 - Pre-E precautions  Term labor symptoms and general obstetric precautions including but not limited to vaginal bleeding, contractions, leaking of fluid and fetal movement were reviewed in detail with the patient. Please refer to After Visit Summary for other counseling recommendations.  Return in about 3 days (around 08/09/2018) for ROB/BP check.  Future Appointments  Date Time Provider Department Center  08/09/2018 10:45 AM Conan Bowens, MD [facility] None    Dorathy Kinsman, CNM

## 2018-08-06 NOTE — MAU Note (Signed)
Pt sent from office for hypertension and headache. Denies any VB or LOF. + FM

## 2018-08-07 ENCOUNTER — Encounter (HOSPITAL_COMMUNITY): Payer: Self-pay

## 2018-08-07 ENCOUNTER — Other Ambulatory Visit: Payer: Self-pay

## 2018-08-07 ENCOUNTER — Inpatient Hospital Stay (HOSPITAL_COMMUNITY)
Admission: AD | Admit: 2018-08-07 | Discharge: 2018-08-10 | DRG: 806 | Disposition: A | Discharge status: Home / Self Care | Payer: Medicaid Other | Attending: Obstetrics and Gynecology | Admitting: Obstetrics and Gynecology

## 2018-08-07 DIAGNOSIS — Z22322 Carrier or suspected carrier of Methicillin resistant Staphylococcus aureus: Secondary | ICD-10-CM | POA: Diagnosis not present

## 2018-08-07 DIAGNOSIS — O133 Gestational [pregnancy-induced] hypertension without significant proteinuria, third trimester: Secondary | ICD-10-CM | POA: Diagnosis not present

## 2018-08-07 DIAGNOSIS — N179 Acute kidney failure, unspecified: Secondary | ICD-10-CM | POA: Diagnosis present

## 2018-08-07 DIAGNOSIS — O479 False labor, unspecified: Secondary | ICD-10-CM

## 2018-08-07 DIAGNOSIS — O99214 Obesity complicating childbirth: Secondary | ICD-10-CM | POA: Diagnosis present

## 2018-08-07 DIAGNOSIS — O134 Gestational [pregnancy-induced] hypertension without significant proteinuria, complicating childbirth: Secondary | ICD-10-CM | POA: Diagnosis not present

## 2018-08-07 DIAGNOSIS — O368330 Maternal care for abnormalities of the fetal heart rate or rhythm, third trimester, not applicable or unspecified: Secondary | ICD-10-CM | POA: Diagnosis present

## 2018-08-07 DIAGNOSIS — Z3480 Encounter for supervision of other normal pregnancy, unspecified trimester: Secondary | ICD-10-CM

## 2018-08-07 DIAGNOSIS — O139 Gestational [pregnancy-induced] hypertension without significant proteinuria, unspecified trimester: Secondary | ICD-10-CM | POA: Diagnosis present

## 2018-08-07 DIAGNOSIS — O99824 Streptococcus B carrier state complicating childbirth: Secondary | ICD-10-CM | POA: Diagnosis present

## 2018-08-07 DIAGNOSIS — O99820 Streptococcus B carrier state complicating pregnancy: Secondary | ICD-10-CM

## 2018-08-07 DIAGNOSIS — O9989 Other specified diseases and conditions complicating pregnancy, childbirth and the puerperium: Secondary | ICD-10-CM | POA: Diagnosis present

## 2018-08-07 DIAGNOSIS — Z3490 Encounter for supervision of normal pregnancy, unspecified, unspecified trimester: Secondary | ICD-10-CM

## 2018-08-07 DIAGNOSIS — Z3A37 37 weeks gestation of pregnancy: Secondary | ICD-10-CM | POA: Diagnosis not present

## 2018-08-07 DIAGNOSIS — O4700 False labor before 37 completed weeks of gestation, unspecified trimester: Secondary | ICD-10-CM

## 2018-08-07 LAB — URINALYSIS, ROUTINE W REFLEX MICROSCOPIC
Bacteria, UA: NONE SEEN
Bilirubin Urine: NEGATIVE
Glucose, UA: NEGATIVE mg/dL
Hgb urine dipstick: NEGATIVE
Ketones, ur: 20 mg/dL — AB
Leukocytes, UA: NEGATIVE
Nitrite: NEGATIVE
Protein, ur: 100 mg/dL — AB
SPECIFIC GRAVITY, URINE: 1.025 (ref 1.005–1.030)
pH: 7 (ref 5.0–8.0)

## 2018-08-07 LAB — COMPREHENSIVE METABOLIC PANEL
ALT: 14 U/L (ref 0–44)
ANION GAP: 8 (ref 5–15)
AST: 21 U/L (ref 15–41)
Albumin: 3.2 g/dL — ABNORMAL LOW (ref 3.5–5.0)
Alkaline Phosphatase: 239 U/L — ABNORMAL HIGH (ref 38–126)
BUN: 8 mg/dL (ref 6–20)
CO2: 21 mmol/L — ABNORMAL LOW (ref 22–32)
Calcium: 8.9 mg/dL (ref 8.9–10.3)
Chloride: 106 mmol/L (ref 98–111)
Creatinine, Ser: 0.61 mg/dL (ref 0.44–1.00)
GFR calc Af Amer: >60 mL/min (ref >60)
GFR calc non Af Amer: >60 mL/min (ref >60)
Glucose, Bld: 86 mg/dL (ref 70–99)
Potassium: 4 mmol/L (ref 3.5–5.1)
Sodium: 135 mmol/L (ref 135–145)
Total Bilirubin: 0.6 mg/dL (ref 0.3–1.2)
Total Protein: 6.7 g/dL (ref 6.5–8.1)

## 2018-08-07 LAB — CBC
HCT: 35.9 % — ABNORMAL LOW (ref 36.0–46.0)
HEMOGLOBIN: 11.8 g/dL — AB (ref 12.0–15.0)
MCH: 30.5 pg (ref 26.0–34.0)
MCHC: 32.9 g/dL (ref 30.0–36.0)
MCV: 92.8 fL (ref 80.0–100.0)
Platelets: 222 10*3/uL (ref 150–400)
RBC: 3.87 MIL/uL (ref 3.87–5.11)
RDW: 13.4 % (ref 11.5–15.5)
WBC: 8.7 10*3/uL (ref 4.0–10.5)
nRBC: 0 % (ref 0.0–0.2)

## 2018-08-07 LAB — TYPE AND SCREEN
ABO/RH(D): B POS
Antibody Screen: NEGATIVE

## 2018-08-07 MED ORDER — ONDANSETRON HCL 4 MG/2ML IJ SOLN
4.0000 mg | Freq: Four times a day (QID) | INTRAMUSCULAR | Status: DC | PRN
  Administered 2018-08-07: 4 mg via INTRAVENOUS
  Filled 2018-08-07: qty 2

## 2018-08-07 MED ORDER — OXYCODONE-ACETAMINOPHEN 5-325 MG PO TABS: 2.0000 | ORAL_TABLET | ORAL | Status: DC | PRN

## 2018-08-07 MED ORDER — PENICILLIN G 3 MILLION UNITS IVPB - SIMPLE MED
3.0000 10*6.[IU] | INTRAVENOUS | Status: DC
  Administered 2018-08-07 – 2018-08-08 (×6): 3 10*6.[IU] via INTRAVENOUS
  Filled 2018-08-07 (×9): qty 100

## 2018-08-07 MED ORDER — ACETAMINOPHEN 325 MG PO TABS
650.0000 mg | ORAL_TABLET | ORAL | Status: DC | PRN
  Administered 2018-08-07: 650 mg via ORAL
  Filled 2018-08-07: qty 2

## 2018-08-07 MED ORDER — LIDOCAINE HCL (PF) 1 % IJ SOLN
30.0000 mL | INTRAMUSCULAR | Status: DC | PRN
  Filled 2018-08-07: qty 30

## 2018-08-07 MED ORDER — TERBUTALINE SULFATE 1 MG/ML IJ SOLN
0.2500 mg | Freq: Once | INTRAMUSCULAR | Status: DC | PRN
  Filled 2018-08-07: qty 1

## 2018-08-07 MED ORDER — OXYTOCIN 40 UNITS IN NORMAL SALINE INFUSION - SIMPLE MED
1.0000 m[IU]/min | INTRAVENOUS | Status: DC
  Administered 2018-08-07: 2 m[IU]/min via INTRAVENOUS
  Filled 2018-08-07: qty 1000

## 2018-08-07 MED ORDER — LACTATED RINGERS IV SOLN
INTRAVENOUS | Status: DC
  Administered 2018-08-07 – 2018-08-08 (×4): via INTRAVENOUS

## 2018-08-07 MED ORDER — SOD CITRATE-CITRIC ACID 500-334 MG/5ML PO SOLN: 30.0000 mL | ORAL | Status: DC | PRN

## 2018-08-07 MED ORDER — OXYTOCIN 40 UNITS IN NORMAL SALINE INFUSION - SIMPLE MED: 2.5000 [IU]/h | INTRAVENOUS | Status: DC

## 2018-08-07 MED ORDER — MISOPROSTOL 50MCG HALF TABLET
50.0000 ug | ORAL_TABLET | Freq: Four times a day (QID) | ORAL | Status: DC | PRN
  Administered 2018-08-07: 50 ug via BUCCAL
  Filled 2018-08-07 (×2): qty 1

## 2018-08-07 MED ORDER — SODIUM CHLORIDE 0.9 % IV SOLN
5.0000 10*6.[IU] | Freq: Once | INTRAVENOUS | Status: AC
  Administered 2018-08-07: 5 10*6.[IU] via INTRAVENOUS
  Filled 2018-08-07: qty 5

## 2018-08-07 MED ORDER — FENTANYL CITRATE (PF) 100 MCG/2ML IJ SOLN
100.0000 ug | INTRAMUSCULAR | Status: DC | PRN
  Administered 2018-08-07 (×2): 100 ug via INTRAVENOUS
  Filled 2018-08-07 (×2): qty 2

## 2018-08-07 MED ORDER — OXYTOCIN BOLUS FROM INFUSION
500.0000 mL | Freq: Once | INTRAVENOUS | Status: AC
  Administered 2018-08-08: 500 mL via INTRAVENOUS

## 2018-08-07 MED ORDER — OXYCODONE-ACETAMINOPHEN 5-325 MG PO TABS: 1.0000 | ORAL_TABLET | ORAL | Status: DC | PRN

## 2018-08-07 MED ORDER — LACTATED RINGERS IV SOLN
500.0000 mL | INTRAVENOUS | Status: DC | PRN
  Administered 2018-08-08 (×3): 500 mL via INTRAVENOUS

## 2018-08-07 NOTE — H&P (Addendum)
Carla Hammond is a 23 y.o. 24P0020 female at 2445w5d presenting for IOL for gHTN; she is GBS+. She presented to MAU this morning for a repeat BP check and headache. She was prepared to be admitted and had packed a bag last night, is aware this may be a longer process. She does not have a specific birth plan, and states "I'll just go with the flow, as long as she's ok, I'm ok." Discussed IOL options, she is amenable to FB with cytotec to begin the process.  OB History    Gravida  3   Para  0   Term      Preterm      AB  2   Living        SAB      TAB  2   Ectopic      Multiple      Live Births             Past Medical History:  Diagnosis Date  . Asthma   . Complication of anesthesia    Past Surgical History:  Procedure Laterality Date  . INDUCED ABORTION     Family History: family history includes Heart disease in her maternal grandfather; Hypertension in her mother; Stroke in her maternal grandfather. Social History:  reports that she has never smoked. She has never used smokeless tobacco. She reports previous alcohol use. She reports that she does not use drugs.    Maternal Diabetes: No Genetic Screening: Normal Maternal Ultrasounds/Referrals: Normal Fetal Ultrasounds or other Referrals:  None Maternal Substance Abuse:  No Significant Maternal Medications:  None Significant Maternal Lab Results:  Lab values include: Group B Strep positive Other Comments:  None  Review of Systems  Constitutional: Negative.  Negative for diaphoresis, fever and malaise/fatigue.  HENT: Negative.   Eyes: Positive for blurred vision (today, she thinks due to her contacts). Negative for double vision and photophobia.  Respiratory: Negative.  Negative for cough and shortness of breath.   Cardiovascular: Negative.  Negative for chest pain, palpitations and leg swelling.  Gastrointestinal: Positive for heartburn (sporadically throughout pregnancy), nausea and vomiting (last episode  last night, has not had consistent n/v since first trimester). Negative for abdominal pain, constipation and diarrhea.  Genitourinary: Negative.  Negative for dysuria, flank pain, frequency and urgency.  Musculoskeletal: Negative.   Skin: Negative.  Negative for itching.  Neurological: Positive for headaches (has had increasing headaches over the past week). Negative for dizziness, loss of consciousness and weakness.  Endo/Heme/Allergies: Negative.   Psychiatric/Behavioral: Negative.    Maternal Medical History:  Reason for admission: Nausea.   Fetal activity: Perceived fetal activity is normal.   Last perceived fetal movement was within the past hour.    Prenatal complications: PIH.   Prenatal Complications - Diabetes: none.      Blood pressure (!) 154/96, pulse 97, temperature 98.2 F (36.8 C), resp. rate 20, height 5\' 3"  (1.6 m), weight 106.9 kg, last menstrual period 11/16/2017, SpO2 99 %, unknown if currently breastfeeding. Maternal Exam:  Abdomen: Patient reports no abdominal tenderness. Fetal presentation: vertex     Fetal Exam Fetal Monitor Review: Mode: ultrasound.   Baseline rate: 150.  Variability: moderate (6-25 bpm).   Pattern: accelerations present and no decelerations.   Baby LOT by Leopold's  Fetal State Assessment: Category I - tracings are normal.     Physical Exam  Nursing note and vitals reviewed. Constitutional: She is oriented to person, place, and time. She appears well-developed and  well-nourished. No distress.  HENT:  Head: Normocephalic.  Eyes: Pupils are equal, round, and reactive to light.  Neck: Normal range of motion.  Cardiovascular: Normal rate, regular rhythm and normal heart sounds.  No murmur heard. Respiratory: Effort normal and breath sounds normal. No respiratory distress.  GI: Soft. Bowel sounds are normal. She exhibits no distension. There is no abdominal tenderness.  Musculoskeletal: Normal range of motion.  Neurological:  She is alert and oriented to person, place, and time.  Skin: Skin is warm and dry. No rash noted. She is not diaphoretic.  Psychiatric: She has a normal mood and affect. Her behavior is normal. Judgment and thought content normal.    Prenatal labs: ABO, Rh: B/Positive/-- (08/29 1137) Antibody: Negative (08/29 1137) Rubella: 1.29 (08/29 1137) RPR: Non Reactive (11/13 0919)  HBsAg: Negative (08/29 1137)  HIV: Non Reactive (11/13 0919)  GBS: Positive (01/17 1147)   Assessment/Plan: IOL for gHTN FB with cytotec, progress to AROM and/or oxytocin titration Anticipate NSVD  Bernerd Limbo, SNM 08/06/2018, 10:23 AM   Procedure: Patient informed of R/B/A of procedure. NST was performed and was reactive prior to procedure. NST:  EFM: Baseline: 150 bpm, moderate with 15x15 accels, no decels  Toco: none Procedure done to begin ripening of the cervix prior to admission for induction of labor. Appropriate time out taken. The patient was placed in the lithotomy position and a cervical exam was performed and a finger was used to guide the 25F foley balloon through the internal os of the cervix. Foley Balloon filled with 60cc of sterile water. Plug inserted into end of the foley. Foley placed on tension and taped to medial thigh.    Thressa Sheller DNP, CNM  08/06/18  11:30 AM    I confirm that I have verified the information documented in the nurse midwife student's note and that I have also personally reperformed the physical exam and all medical decision making activities.   Thressa Sheller DNP, CNM  08/06/18  11:31 AM

## 2018-08-07 NOTE — MAU Note (Signed)
Pt states she had elevated BP yesterday in office.  Pt reports headache and some blurry vision.  Reports good fetal movement.  Denies LOF or vag vleeding

## 2018-08-07 NOTE — Progress Notes (Signed)
Pt here for BP check. Seen yesterday in MAU for HTN and HA. Reports HA returned this am. BP 154/96. Recommend IOL today. Mathews Robinsons, CNM notified.

## 2018-08-07 NOTE — Progress Notes (Signed)
OB/GYN Faculty Practice: Labor Progress Note  Subjective: Carla Hammond is a 23 y.o. G57P0020 female at [redacted]w[redacted]d presenting for IOL for gHTN.  In mild-moderate discomfort. Feels contractions are slightly picking up. Overall doing well.   Objective: BP (!) 143/89   Pulse 68   Temp (!) 97.4 F (36.3 C) (Oral)   Resp 16   Ht 5\' 3"  (1.6 m)   Wt 106.9 kg   LMP 11/16/2017   SpO2 99%   BMI 41.76 kg/m  Gen: alert and cooperative  Dilation: 5 Effacement (%): 70 Cervical Position: Posterior Station: -2 Presentation: Vertex Exam by:: Dr Evonnie Dawes, resident  Assessment and Plan: Carla Hammond is a 23 y.o. G48P0020 female at [redacted]w[redacted]d presenting for IOL for gHTN.  Labor: latent  -- pain control: IV pain meds  -- consider AROM on next check if fetus head is well applied and she hasn't changed much on own   Fetal Well-Being:  -- Category 1 -- GBS (+)  Rollene Rotunda, DO PGYI Family Medicine  10:32 PM

## 2018-08-07 NOTE — Anesthesia Pain Management Evaluation Note (Signed)
  CRNA Pain Management Visit Note  Patient: Carla Hammond, 23 y.o., female  "Hello I am a member of the anesthesia team at [facility]. We have an anesthesia team available at all times to provide care throughout the hospital, including epidural management and anesthesia for C-section. I don't know your plan for the delivery whether it a natural birth, water birth, IV sedation, nitrous supplementation, doula or epidural, but we want to meet your pain goals."   1.Was your pain managed to your expectations on prior hospitalizations?   No prior hospitalizations  2.What is your expectation for pain management during this hospitalization?     Epidural  3.How can we help you reach that goal? unsure  Record the patient's initial score and the patient's pain goal.   Pain: 1  Pain Goal: 5 [facility] wants you to be able to say your pain was always managed very well.  Cephus Shelling 08/07/2018

## 2018-08-08 ENCOUNTER — Inpatient Hospital Stay (HOSPITAL_COMMUNITY): Payer: Medicaid Other | Admitting: Anesthesiology

## 2018-08-08 ENCOUNTER — Encounter: Payer: Self-pay | Admitting: Advanced Practice Midwife

## 2018-08-08 ENCOUNTER — Encounter (HOSPITAL_COMMUNITY): Payer: Self-pay | Admitting: Family Medicine

## 2018-08-08 DIAGNOSIS — Z22322 Carrier or suspected carrier of Methicillin resistant Staphylococcus aureus: Secondary | ICD-10-CM | POA: Insufficient documentation

## 2018-08-08 DIAGNOSIS — O133 Gestational [pregnancy-induced] hypertension without significant proteinuria, third trimester: Secondary | ICD-10-CM

## 2018-08-08 DIAGNOSIS — Z3A37 37 weeks gestation of pregnancy: Secondary | ICD-10-CM

## 2018-08-08 DIAGNOSIS — O99824 Streptococcus B carrier state complicating childbirth: Secondary | ICD-10-CM

## 2018-08-08 LAB — COMPREHENSIVE METABOLIC PANEL
ALK PHOS: 239 U/L — AB (ref 38–126)
ALT: 14 U/L (ref 0–44)
ALT: 14 U/L (ref 0–44)
ANION GAP: 6 (ref 5–15)
AST: 20 U/L (ref 15–41)
AST: 25 U/L (ref 15–41)
Albumin: 3 g/dL — ABNORMAL LOW (ref 3.5–5.0)
Albumin: 2.4 g/dL — ABNORMAL LOW (ref 3.5–5.0)
Alkaline Phosphatase: 197 U/L — ABNORMAL HIGH (ref 38–126)
Anion gap: 7 (ref 5–15)
BUN: 8 mg/dL (ref 6–20)
BUN: 9 mg/dL (ref 6–20)
CALCIUM: 8.7 mg/dL — AB (ref 8.9–10.3)
CO2: 20 mmol/L — ABNORMAL LOW (ref 22–32)
CO2: 22 mmol/L (ref 22–32)
CREATININE: 1.11 mg/dL — AB (ref 0.44–1.00)
Calcium: 8.6 mg/dL — ABNORMAL LOW (ref 8.9–10.3)
Chloride: 107 mmol/L (ref 98–111)
Chloride: 110 mmol/L (ref 98–111)
Creatinine, Ser: 0.97 mg/dL (ref 0.44–1.00)
GFR calc Af Amer: >60 mL/min (ref >60)
GFR calc Af Amer: >60 mL/min (ref >60)
GFR calc non Af Amer: >60 mL/min (ref >60)
GFR calc non Af Amer: >60 mL/min (ref >60)
Glucose, Bld: 92 mg/dL (ref 70–99)
Glucose, Bld: 107 mg/dL — ABNORMAL HIGH (ref 70–99)
Potassium: 4.1 mmol/L (ref 3.5–5.1)
Potassium: 3.5 mmol/L (ref 3.5–5.1)
Sodium: 134 mmol/L — ABNORMAL LOW (ref 135–145)
Sodium: 138 mmol/L (ref 135–145)
Total Bilirubin: 0.6 mg/dL (ref 0.3–1.2)
Total Bilirubin: 0.7 mg/dL (ref 0.3–1.2)
Total Protein: 6.7 g/dL (ref 6.5–8.1)
Total Protein: 5.4 g/dL — ABNORMAL LOW (ref 6.5–8.1)

## 2018-08-08 LAB — CBC
HCT: 34.9 % — ABNORMAL LOW (ref 36.0–46.0)
HCT: 34.4 % — ABNORMAL LOW (ref 36.0–46.0)
HCT: 29.9 % — ABNORMAL LOW (ref 36.0–46.0)
Hemoglobin: 11.6 g/dL — ABNORMAL LOW (ref 12.0–15.0)
Hemoglobin: 11.4 g/dL — ABNORMAL LOW (ref 12.0–15.0)
Hemoglobin: 10.1 g/dL — ABNORMAL LOW (ref 12.0–15.0)
MCH: 30.7 pg (ref 26.0–34.0)
MCH: 30.8 pg (ref 26.0–34.0)
MCH: 31.2 pg (ref 26.0–34.0)
MCHC: 33.2 g/dL (ref 30.0–36.0)
MCHC: 33.1 g/dL (ref 30.0–36.0)
MCHC: 33.8 g/dL (ref 30.0–36.0)
MCV: 92.3 fL (ref 80.0–100.0)
MCV: 93 fL (ref 80.0–100.0)
MCV: 92.3 fL (ref 80.0–100.0)
PLATELETS: 217 10*3/uL (ref 150–400)
Platelets: 200 10*3/uL (ref 150–400)
Platelets: 161 10*3/uL (ref 150–400)
RBC: 3.78 MIL/uL — ABNORMAL LOW (ref 3.87–5.11)
RBC: 3.7 MIL/uL — ABNORMAL LOW (ref 3.87–5.11)
RBC: 3.24 MIL/uL — ABNORMAL LOW (ref 3.87–5.11)
RDW: 13.5 % (ref 11.5–15.5)
RDW: 13.4 % (ref 11.5–15.5)
RDW: 13.5 % (ref 11.5–15.5)
WBC: 14.3 10*3/uL — ABNORMAL HIGH (ref 4.0–10.5)
WBC: 16.3 10*3/uL — ABNORMAL HIGH (ref 4.0–10.5)
WBC: 14.8 10*3/uL — AB (ref 4.0–10.5)
nRBC: 0 % (ref 0.0–0.2)
nRBC: 0 % (ref 0.0–0.2)
nRBC: 0 % (ref 0.0–0.2)

## 2018-08-08 LAB — CULTURE, OB URINE

## 2018-08-08 LAB — RPR: RPR Ser Ql: NONREACTIVE

## 2018-08-08 MED ORDER — DIBUCAINE 1 % RE OINT: 1.0000 "application " | TOPICAL_OINTMENT | RECTAL | Status: DC | PRN

## 2018-08-08 MED ORDER — SIMETHICONE 80 MG PO CHEW: 80.0000 mg | CHEWABLE_TABLET | ORAL | Status: DC | PRN

## 2018-08-08 MED ORDER — LACTATED RINGERS IV SOLN
500.0000 mL | Freq: Once | INTRAVENOUS | Status: AC
  Administered 2018-08-08: 500 mL via INTRAVENOUS

## 2018-08-08 MED ORDER — ACETAMINOPHEN 325 MG PO TABS: 650.0000 mg | ORAL_TABLET | ORAL | Status: DC | PRN

## 2018-08-08 MED ORDER — PHENYLEPHRINE 40 MCG/ML (10ML) SYRINGE FOR IV PUSH (FOR BLOOD PRESSURE SUPPORT)
80.0000 ug | PREFILLED_SYRINGE | INTRAVENOUS | Status: DC | PRN
  Filled 2018-08-08: qty 10

## 2018-08-08 MED ORDER — IBUPROFEN 600 MG PO TABS
600.0000 mg | ORAL_TABLET | Freq: Four times a day (QID) | ORAL | Status: DC
  Administered 2018-08-08 – 2018-08-10 (×8): 600 mg via ORAL
  Filled 2018-08-08 (×8): qty 1

## 2018-08-08 MED ORDER — ZOLPIDEM TARTRATE 5 MG PO TABS: 5.0000 mg | ORAL_TABLET | Freq: Every evening | ORAL | Status: DC | PRN

## 2018-08-08 MED ORDER — TETANUS-DIPHTH-ACELL PERTUSSIS 5-2.5-18.5 LF-MCG/0.5 IM SUSP
0.5000 mL | Freq: Once | INTRAMUSCULAR | Status: AC
  Administered 2018-08-10: 0.5 mL via INTRAMUSCULAR
  Filled 2018-08-08: qty 0.5

## 2018-08-08 MED ORDER — LIDOCAINE HCL (PF) 1 % IJ SOLN
INTRAMUSCULAR | Status: DC | PRN
  Administered 2018-08-08 (×2): 4 mL via EPIDURAL

## 2018-08-08 MED ORDER — LABETALOL HCL 5 MG/ML IV SOLN
20.0000 mg | INTRAVENOUS | Status: DC | PRN
  Administered 2018-08-08: 20 mg via INTRAVENOUS

## 2018-08-08 MED ORDER — PHENYLEPHRINE 40 MCG/ML (10ML) SYRINGE FOR IV PUSH (FOR BLOOD PRESSURE SUPPORT)
80.0000 ug | PREFILLED_SYRINGE | INTRAVENOUS | Status: DC | PRN
  Filled 2018-08-08 (×2): qty 10

## 2018-08-08 MED ORDER — DIPHENHYDRAMINE HCL 25 MG PO CAPS: 25.0000 mg | ORAL_CAPSULE | Freq: Four times a day (QID) | ORAL | Status: DC | PRN

## 2018-08-08 MED ORDER — EPHEDRINE 5 MG/ML INJ
10.0000 mg | INTRAVENOUS | Status: DC | PRN
  Filled 2018-08-08: qty 2

## 2018-08-08 MED ORDER — SENNOSIDES-DOCUSATE SODIUM 8.6-50 MG PO TABS
2.0000 | ORAL_TABLET | ORAL | Status: DC
  Administered 2018-08-09 (×2): 2 via ORAL
  Filled 2018-08-08 (×2): qty 2

## 2018-08-08 MED ORDER — ONDANSETRON HCL 4 MG/2ML IJ SOLN: 4.0000 mg | INTRAMUSCULAR | Status: DC | PRN

## 2018-08-08 MED ORDER — ONDANSETRON HCL 4 MG PO TABS: 4.0000 mg | ORAL_TABLET | ORAL | Status: DC | PRN

## 2018-08-08 MED ORDER — FENTANYL 2.5 MCG/ML BUPIVACAINE 1/10 % EPIDURAL INFUSION (WH - ANES)
14.0000 mL/h | INTRAMUSCULAR | Status: DC | PRN
  Administered 2018-08-08 (×2): 14 mL/h via EPIDURAL
  Filled 2018-08-08 (×2): qty 100

## 2018-08-08 MED ORDER — HYDRALAZINE HCL 20 MG/ML IJ SOLN: 10.0000 mg | INTRAMUSCULAR | Status: DC | PRN

## 2018-08-08 MED ORDER — BENZOCAINE-MENTHOL 20-0.5 % EX AERO
1.0000 "application " | INHALATION_SPRAY | CUTANEOUS | Status: DC | PRN
  Filled 2018-08-08: qty 56

## 2018-08-08 MED ORDER — LABETALOL HCL 5 MG/ML IV SOLN: 80.0000 mg | INTRAVENOUS | Status: DC | PRN

## 2018-08-08 MED ORDER — DIPHENHYDRAMINE HCL 50 MG/ML IJ SOLN: 12.5000 mg | INTRAMUSCULAR | Status: DC | PRN

## 2018-08-08 MED ORDER — LABETALOL HCL 5 MG/ML IV SOLN: 40.0000 mg | INTRAVENOUS | Status: DC | PRN

## 2018-08-08 MED ORDER — COCONUT OIL OIL
1.0000 "application " | TOPICAL_OIL | Status: DC | PRN
  Administered 2018-08-09: 1 via TOPICAL
  Filled 2018-08-08: qty 120

## 2018-08-08 MED ORDER — WITCH HAZEL-GLYCERIN EX PADS: 1.0000 "application " | MEDICATED_PAD | CUTANEOUS | Status: DC | PRN

## 2018-08-08 MED ORDER — PRENATAL MULTIVITAMIN CH
1.0000 | ORAL_TABLET | Freq: Every day | ORAL | Status: DC
  Administered 2018-08-09 – 2018-08-10 (×2): 1 via ORAL
  Filled 2018-08-08 (×2): qty 1

## 2018-08-08 MED ORDER — OXYCODONE HCL 5 MG PO TABS: 5.0000 mg | ORAL_TABLET | ORAL | Status: DC | PRN

## 2018-08-08 MED ORDER — LABETALOL HCL 5 MG/ML IV SOLN
INTRAVENOUS | Status: AC
  Filled 2018-08-08: qty 4

## 2018-08-08 NOTE — Progress Notes (Signed)
Post Partum Day 1 Subjective: up ad lib, voiding, tolerating PO and + flatus  Objective: Blood pressure 123/70, pulse 62, temperature 98 F (36.7 C), temperature source Oral, resp. rate 18, height 5\' 3"  (1.6 m), weight 106.9 kg, last menstrual period 11/16/2017, SpO2 98 %, unknown if currently breastfeeding.  Physical Exam:  General: alert and cooperative Lochia: appropriate Uterine Fundus: firm Incision: n/a DVT Evaluation: No evidence of DVT seen on physical exam.  Recent Labs    08/08/18 2035 08/09/18 0538  HGB 10.1* 9.9*  HCT 29.9* 30.2*    Assessment/Plan: Plan for discharge tomorrow   LOS: 2 days   Cristine Polio 08/09/2018, 7:31 AM

## 2018-08-08 NOTE — Anesthesia Preprocedure Evaluation (Signed)
Anesthesia Evaluation  Patient identified by MRN, date of birth, ID band Patient awake    Reviewed: Allergy & Precautions, Patient's Chart, lab work & pertinent test results  History of Anesthesia Complications Negative for: history of anesthetic complications  Airway Mallampati: II  TM Distance: >3 FB Neck ROM: Full    Dental  (+) Teeth Intact   Pulmonary asthma ,    Pulmonary exam normal breath sounds clear to auscultation       Cardiovascular hypertension (gestational), Normal cardiovascular exam Rhythm:Regular Rate:Normal     Neuro/Psych negative neurological ROS     GI/Hepatic negative GI ROS, Neg liver ROS,   Endo/Other  Morbid obesity  Renal/GU negative Renal ROS     Musculoskeletal negative musculoskeletal ROS (+)   Abdominal   Peds  Hematology negative hematology ROS (+)   Anesthesia Other Findings Day of surgery medications reviewed with the patient.  Reproductive/Obstetrics (+) Pregnancy                             Anesthesia Physical Anesthesia Plan  ASA: III  Anesthesia Plan: Epidural   Post-op Pain Management:    Induction:   PONV Risk Score and Plan: Treatment may vary due to age or medical condition  Airway Management Planned: Natural Airway  Additional Equipment:   Intra-op Plan:   Post-operative Plan:   Informed Consent: I have reviewed the patients History and Physical, chart, labs and discussed the procedure including the risks, benefits and alternatives for the proposed anesthesia with the patient or authorized representative who has indicated his/her understanding and acceptance.       Plan Discussed with:   Anesthesia Plan Comments:         Anesthesia Quick Evaluation

## 2018-08-08 NOTE — Discharge Summary (Signed)
OB Discharge Summary     Patient Name: Carla Hammond DOB: 06/10/1996 MRN: [id number]  Date of admission: 08/07/2018 Delivering MD: Marylene Land   Date of discharge: 08/10/2018  Admitting diagnosis: CTX Intrauterine pregnancy: [redacted]w[redacted]d     Secondary diagnosis:  Principal Problem:   Gestational hypertension Active Problems:   Encounter for supervision of normal pregnancy, unspecified, unspecified trimester   GBS (group B Streptococcus carrier), +RV culture, currently pregnant  Additional problems: AKI with Cr. 0.61 > 1.1 on 08/08/2018     Discharge diagnosis: Term Pregnancy Delivered                                                                                                Post partum procedures:None  Augmentation: Pitocin, Cytotec and Foley Balloon  Complications: None  Hospital course:  Induction of Labor With Vaginal Delivery     23 y.o. yo G3P0020 at [redacted]w[redacted]d was admitted for induction on 08/07/2018 due to gHTN following a repeat BP check and H/A in MAU. Patient had a labor course remarkable for cx ripening using the usual measures and delivering on the following afternoon. Her creatinine increased from 0.61 to 1.1 prior to delivery, but her urine output remained adequate. PCR was neg and BPs never reached severe range.  Membrane Rupture Time/Date: 11:48 PM ,08/07/2018   Intrapartum Procedures: Episiotomy: None [1]                                         Lacerations:  2nd degree [3];Perineal [11]  Patient had a delivery of a Viable infant. 08/08/2018  Information for the patient's newborn:  Flonnie, Felt [[id number]]  Delivery Method: Vaginal, Spontaneous(Filed from Delivery Summary)    Pateint had a postpartum course remarkable for being started on Maxzide on PPD#2 prior to d/c due to labile BPs, none in severe range, and also some normotensive pressures.  She is ambulating, tolerating a regular diet, passing flatus, and urinating well. Patient is  discharged home in stable condition on 08/10/18.   Physical exam  Vitals:   08/09/18 0521 08/09/18 1449 08/09/18 2238 08/10/18 0521  BP: 123/70 (!) 157/96 133/82 (!) 109/52  Pulse: 62 72 73 88  Resp: 18 18 17 17   Temp: 98 F (36.7 C) (!) 97.5 F (36.4 C) 98.4 F (36.9 C) 98.3 F (36.8 C)  TempSrc: Oral Oral Oral   SpO2: 98%   99%  Weight:      Height:       General: alert and cooperative Lochia: appropriate Uterine Fundus: firm Incision: N/A DVT Evaluation: No evidence of DVT seen on physical exam. 1-2+ edema of BLE Labs: Lab Results  Component Value Date   WBC 11.7 (H) 08/09/2018   HGB 9.9 (L) 08/09/2018   HCT 30.2 (L) 08/09/2018   MCV 92.4 08/09/2018   PLT 162 08/09/2018   CMP Latest Ref Rng & Units 08/09/2018  Glucose 70 - 99 mg/dL 85  BUN 6 - 20 mg/dL 8  Creatinine 0.04 - 5.99  mg/dL 8.11  Sodium 572 - 620 mmol/L 135  Potassium 3.5 - 5.1 mmol/L 3.5  Chloride 98 - 111 mmol/L 110  CO2 22 - 32 mmol/L 20(L)  Calcium 8.9 - 10.3 mg/dL 8.1(L)  Total Protein 6.5 - 8.1 g/dL 3.5(D)  Total Bilirubin 0.3 - 1.2 mg/dL 0.5  Alkaline Phos 38 - 126 U/L 169(H)  AST 15 - 41 U/L 25  ALT 0 - 44 U/L 13    Discharge instruction: per After Visit Summary and "Baby and Me Booklet".  After visit meds:  Allergies as of 08/10/2018   No Known Allergies     Medication List    STOP taking these medications   acetaminophen 500 MG tablet Commonly known as:  TYLENOL   calcium carbonate 500 MG chewable tablet Commonly known as:  TUMS - dosed in mg elemental calcium   cyclobenzaprine 10 MG tablet Commonly known as:  FLEXERIL     TAKE these medications   albuterol 108 (90 Base) MCG/ACT inhaler Commonly known as:  PROVENTIL HFA;VENTOLIN HFA Inhale 2 puffs into the lungs every 6 (six) hours as needed for wheezing or shortness of breath.   ibuprofen 600 MG tablet Commonly known as:  ADVIL,MOTRIN Take 1 tablet (600 mg total) by mouth every 6 (six) hours as needed.   prenatal  multivitamin Tabs tablet Take 1 tablet by mouth daily at 12 noon.   triamterene-hydrochlorothiazide 37.5-25 MG tablet Commonly known as:  MAXZIDE-25 Take 1 tablet by mouth daily.       Diet: low salt diet  Activity: Advance as tolerated. Pelvic rest for 6 weeks.   Outpatient follow up:4 weeks, 3 days for BP check Follow up Appt: Future Appointments  Date Time Provider Department Center  08/11/2018 10:30 AM [facility] NURSE [facility] None  09/06/2018  1:00 PM Leftwich-Kirby, Wilmer Floor, CNM [facility] None   Follow up Visit:No follow-ups on file.  Postpartum contraception: IUD Mirena  Newborn Data: Live born female  Birth Weight: 6 lb 3.8 oz (2830 g) APGAR: 9, 9  Newborn Delivery   Birth date/time:  08/08/2018 14:05:00 Delivery type:  Vaginal, Spontaneous     Baby Feeding: Breast Disposition:home with mother   08/10/2018 Arabella Merles, CNM  10:19 AM'

## 2018-08-08 NOTE — Progress Notes (Signed)
[facility]N [facility]: Labor Progress Note  Subjective: Carla Hammond a 23 y.o.G3P0025female at76w5dpresenting for IOL for gHTN.  Alerted for fetal bradycardia. Position changes, bolus of LR, and oxygen applied.   Objective: BP 135/72   Pulse 63   Temp 98 F (36.7 C) (Oral)   Resp 16   Ht 5\' 3"  (1.6 m)   Wt 106.9 kg   LMP 11/16/2017   SpO2 99%   BMI 41.76 kg/m  Gen: alert and cooperative  Dilation: 8 Effacement (%): 90 Cervical Position: Middle Station: -2 Presentation: Vertex Exam by:: Rhett Bannister  Assessment and Plan: Carla Hammond a 23 y.o.G3P0066female at78w5dpresenting for IOL for gHTN.  Labor: active  -- pain control: epidural --fetal scalp monitor applied  --baseline HR prior for fetus was 140's, will continue to monitor as baseline 160's after application of fetal scalp monitor   Fetal Well-Being:  -- Category 2: 170's, min variability, no accelerations, variable decelerations  -- GBS (+)  3:46 AM

## 2018-08-08 NOTE — Progress Notes (Signed)
LABOR PROGRESS NOTE  Carla Hammond is a 23 y.o. G3P0020 at [redacted]w[redacted]d admitted for IOL for gHTN.  Subjective: Patient is seen resting in bed with occasional need to push. She is otherwise still feeling baby move. Epidural in place and she feels little to nothing in her pelvis.  Objective: BP (!) 160/89   Pulse 69   Temp 98.6 F (37 C) (Oral)   Resp 18   Ht 5\' 3"  (1.6 m)   Wt 106.9 kg   LMP 11/16/2017   SpO2 99%   BMI 41.76 kg/m  or  Vitals:   08/08/18 1030 08/08/18 1040 08/08/18 1055 08/08/18 1105  BP: (!) 163/88 (!) 148/89 (!) 163/72 (!) 160/89  Pulse: 68 69 78 69  Resp: 16 16 18    Temp:      TempSrc:      SpO2:      Weight:      Height:      Dilation: 10 Dilation Complete Date: 08/08/18 Dilation Complete Time: 0945 Effacement (%): 100 Cervical Position: Middle Station: Plus 1 Presentation: Vertex Exam by:: Welford Roche, RNC FHT: baseline rate 150, moderate varibility, 15x15 acel, early variables decel Toco: Pit  Labs: Lab Results  Component Value Date   WBC 14.3 (H) 08/08/2018   HGB 11.6 (L) 08/08/2018   HCT 34.9 (L) 08/08/2018   MCV 92.3 08/08/2018   PLT 217 08/08/2018    Patient Active Problem List   Diagnosis Date Noted  . MRSA (methicillin resistant staph aureus) culture positive 08/08/2018  . Gestational hypertension 08/07/2018  . Hypertension affecting pregnancy, third trimester 08/06/2018  . GBS (group B Streptococcus carrier), +RV culture, currently pregnant 08/02/2018  . Preterm contractions 07/19/2018  . Genetic carrier status 06/23/2018  . Encounter for supervision of normal pregnancy, unspecified, unspecified trimester 03/11/2018    Assessment / Plan: 23 y.o. G3P0020 at [redacted]w[redacted]d here for IOL for gHTN.  Labor: Active Fetal Wellbeing:  Cat 1 Pain Control:  Epidural Anticipated MOD:  Vaginal   Peggyann Shoals, DO [facility], PGY-1 08/08/2018 3:22 PM

## 2018-08-08 NOTE — Lactation Note (Signed)
This note was copied from a baby's chart. Lactation Consultation Note  Patient Name: Carla Hammond IRJJO'A Date: 08/08/2018 Reason for consult: Initial assessment;Primapara;1st time breastfeeding;Early term 42-38.6wks  4 hours old early term female who is being exclusively BF by her mother, this is her first live birth. Mom interrupted two pregnancies prior this one, so this is also her first time BF. She took BF classes at [employer], and also participated in the [facility] program at the [facility]. When [facility] assisted mom with hand expression, colostrum was easily expressed out of her nipple, LC showed mom how to finger feed baby. Noticed that baby had a very tight grip when doing finger feeding and some suck training. He would bite on LC's finger with very little sucking, baby was also very spitty.   Offered assistance with latch, baby already crying and mom took her STS to her left breast but baby not able to latch at this point, she'd just cry and when she latched on for a few seconds, mom voiced "it was pinching" LC broke the latch and worked on more suck training but baby fell asleep. An attempt was documented in Flowsheets. Discussed feeding cues, cluster feeding and normal newborn behavior. Mom doesn't have a pump at home, [facility] offered one from the hospital. Instructions, cleaning and storage were reviewed as well as milk storage guidelines.  Feeding plan:  1. Encouraged mom to feed baby STS 8-12 times/24 hours or sooner if feeding cues are present 2. Hand expression/pumping and finger/spoon feeding was also encouraged  BF brochure, BF resources and feeding diary were reviewed. Mom reported all questions and concerns were answered, she's aware of LC services and will call PRN.  Maternal Data Formula Feeding for Exclusion: No Has patient been taught Hand Expression?: Yes Does the patient have breastfeeding experience prior to this delivery?: No  Feeding Feeding Type: Breast  Fed  LATCH Score Latch: Grasps breast easily, tongue down, lips flanged, rhythmical sucking.  Audible Swallowing: A few with stimulation  Type of Nipple: Everted at rest and after stimulation  Comfort (Breast/Nipple): Soft / non-tender  Hold (Positioning): Assistance needed to correctly position infant at breast and maintain latch.  LATCH Score: 8  Interventions Interventions: Breast feeding basics reviewed;Assisted with latch;Skin to skin;Breast massage;Hand express;Breast compression;Hand pump;Support pillows;Adjust position  Lactation Tools Discussed/Used Tools: Pump Breast pump type: Manual WIC Program: Yes Pump Review: Setup, frequency, and cleaning;Milk Storage Initiated by:: MPeck Date initiated:: 08/08/18   Consult Status Consult Status: Follow-up Date: 08/09/18 Follow-up type: In-patient    Katheen Aslin Venetia Constable 08/08/2018, 6:35 PM

## 2018-08-08 NOTE — Progress Notes (Signed)
   Isabel Vandergrift is a 23 y.o. G3P0020 at [redacted]w[redacted]d  admitted for induction of labor due to Hypertension. She had normal PCR and Labs, no diagnosis of pre-e at this time.   Subjective: Complete and pushing with RN at the bedside.   Objective: Vitals:   08/08/18 1120 08/08/18 1140 08/08/18 1150 08/08/18 1200  BP: (!) 147/76 (!) 141/74 (!) 135/55 (!) 151/72  Pulse: 76 81 71 77  Resp:   18 18  Temp:      TempSrc:      SpO2:      Weight:      Height:       Total I/O In: -  Out: 350 [Urine:350]  FHT:  FHR: 150 bpm, variability: moderate,  accelerations:  Present,  decelerations:  Present variables UC:   Irregular SVE:   Dilation: 10 Effacement (%): 100 Station: Plus 1 Exam by:: Welford Roche, RNC Pitocin @ 6 mu/min  Labs: Lab Results  Component Value Date   WBC 14.3 (H) 08/08/2018   HGB 11.6 (L) 08/08/2018   HCT 34.9 (L) 08/08/2018   MCV 92.3 08/08/2018   PLT 217 08/08/2018   Urine output now adequate-400 ml of urine between 10 AM and 2 Pm.  AST, ALT normal, platelets normal but creatine is 1.1, increased from  0.61 yesterday; will continue to hydrate during the day today.   Assessment / Plan: Complete; laboring down  Labor: Progressing normally Fetal Wellbeing:  Category I Pain Control:  Epidural Anticipated MOD:  NSVD  Charlesetta Garibaldi Domonique Brouillard 08/08/2018, 1:26 PM

## 2018-08-08 NOTE — Anesthesia Procedure Notes (Signed)
Epidural Patient location during procedure: OB Start time: 08/08/2018 12:55 AM End time: 08/08/2018 1:00 AM  Staffing Anesthesiologist: Kaylyn Layer, MD Performed: anesthesiologist   Preanesthetic Checklist Completed: patient identified, pre-op evaluation, timeout performed, IV checked, risks and benefits discussed and monitors and equipment checked  Epidural Patient position: sitting Prep: site prepped and draped and DuraPrep Patient monitoring: continuous pulse ox, blood pressure, heart rate and cardiac monitor Approach: midline Location: L3-L4 Injection technique: LOR air  Needle:  Needle type: Tuohy  Needle gauge: 17 G Needle length: 9 cm Needle insertion depth: 7 cm Catheter type: closed end flexible Catheter size: 19 Gauge Catheter at skin depth: 12 cm Test dose: negative and Other (1% lidocaine)  Assessment Events: blood not aspirated, injection not painful, no injection resistance, negative IV test and no paresthesia  Additional Notes Patient identified. Risks, benefits, and alternatives discussed with patient including but not limited to bleeding, infection, nerve damage, paralysis, failed block, incomplete pain control, headache, blood pressure changes, nausea, vomiting, reactions to medication, itching, and postpartum back pain. Confirmed with bedside nurse the patient's most recent platelet count. Confirmed with patient that they are not currently taking any anticoagulation, have any bleeding history, or any family history of bleeding disorders. Patient expressed understanding and wished to proceed. All questions were answered. Sterile technique was used throughout the entire procedure. Two attempts required to access interspinous space. Crisp LOR to air. Please see nursing notes for vital signs. Test dose was given through epidural catheter and negative prior to continuing to dose epidural or start infusion. Warning signs of high block given to the patient including  shortness of breath, tingling/numbness in hands, complete motor block, or any concerning symptoms with instructions to call for help. Patient was given instructions on fall risk and not to get out of bed. All questions and concerns addressed with instructions to call with any issues or inadequate analgesia.  Reason for block:procedure for pain

## 2018-08-09 ENCOUNTER — Encounter: Payer: Medicaid Other | Admitting: Obstetrics and Gynecology

## 2018-08-09 LAB — CBC
HCT: 30.2 % — ABNORMAL LOW (ref 36.0–46.0)
Hemoglobin: 9.9 g/dL — ABNORMAL LOW (ref 12.0–15.0)
MCH: 30.3 pg (ref 26.0–34.0)
MCHC: 32.8 g/dL (ref 30.0–36.0)
MCV: 92.4 fL (ref 80.0–100.0)
NRBC: 0 % (ref 0.0–0.2)
Platelets: 162 10*3/uL (ref 150–400)
RBC: 3.27 MIL/uL — ABNORMAL LOW (ref 3.87–5.11)
RDW: 13.7 % (ref 11.5–15.5)
WBC: 11.7 10*3/uL — ABNORMAL HIGH (ref 4.0–10.5)

## 2018-08-09 LAB — COMPREHENSIVE METABOLIC PANEL
ALT: 13 U/L (ref 0–44)
AST: 25 U/L (ref 15–41)
Albumin: 2.5 g/dL — ABNORMAL LOW (ref 3.5–5.0)
Alkaline Phosphatase: 169 U/L — ABNORMAL HIGH (ref 38–126)
Anion gap: 5 (ref 5–15)
BUN: 8 mg/dL (ref 6–20)
CO2: 20 mmol/L — ABNORMAL LOW (ref 22–32)
Calcium: 8.1 mg/dL — ABNORMAL LOW (ref 8.9–10.3)
Chloride: 110 mmol/L (ref 98–111)
Creatinine, Ser: 0.85 mg/dL (ref 0.44–1.00)
GFR calc Af Amer: >60 mL/min (ref >60)
GFR calc non Af Amer: >60 mL/min (ref >60)
GLUCOSE: 85 mg/dL (ref 70–99)
Potassium: 3.5 mmol/L (ref 3.5–5.1)
Sodium: 135 mmol/L (ref 135–145)
Total Bilirubin: 0.5 mg/dL (ref 0.3–1.2)
Total Protein: 5.6 g/dL — ABNORMAL LOW (ref 6.5–8.1)

## 2018-08-09 NOTE — Lactation Note (Signed)
This note was copied from a baby's chart. Lactation Consultation Note  Patient Name: Carla Hammond [id number] Date: 08/09/2018 Reason for consult: Follow-up assessment;Early term 37-38.6wks;Primapara;1st time breastfeeding  P1 mother whose infant is now 38 hours old.  Nursing student in room doing assessment and baby very fussy.  Assisted student by allowing baby to suck on my gloved finger which settled her.  Instructor came in and continued assessment.  Baby continued to be fussy throughout the assessment.  Allowed baby time to settle down by sucking on my gloved finger.  She started to "bite" on my finger so suck training was performed.  Baby began grasping and sucking better after about 5 minutes.  Assisted to latch in the football hold on the right breast.  Showed mother how to position properly and to wait for a wide gape prior to latching.  Demonstrated how to obtain and maintain a deep latch.  Reassured mother that a deep latch is an effective latch and that baby could easily breathe while latched.  Baby began sucking and breast compressions demonstrated.  Mother denied pain with latching.  I continued to observe for 5 minutes before leaving mother's room.  Mother will continue to feed 8-12 times/24 hours or sooner if baby shows cues.  Mother will call for latch assistance as needed.     Maternal Data Formula Feeding for Exclusion: No Has patient been taught Hand Expression?: Yes Does the patient have breastfeeding experience prior to this delivery?: No  Feeding Feeding Type: Breast Fed  LATCH Score Latch: Grasps breast easily, tongue down, lips flanged, rhythmical sucking.  Audible Swallowing: None  Type of Nipple: Everted at rest and after stimulation  Comfort (Breast/Nipple): Soft / non-tender  Hold (Positioning): Assistance needed to correctly position infant at breast and maintain latch.  LATCH Score: 7  Interventions Interventions: Breast feeding basics  reviewed;Assisted with latch;Skin to skin;Breast massage;Hand express;Breast compression;Position options;Support pillows;Adjust position  Lactation Tools Discussed/Used WIC Program: Yes   Consult Status Consult Status: Follow-up Date: 08/10/18 Follow-up type: In-patient    Loza Prell R Harbor Vanover 08/09/2018, 3:52 PM

## 2018-08-09 NOTE — Anesthesia Postprocedure Evaluation (Signed)
Anesthesia Post Note  Patient: Carla Hammond  Procedure(s) Performed: AN AD HOC LABOR EPIDURAL     Patient location during evaluation: Mother Baby Anesthesia Type: Epidural Level of consciousness: awake and alert and oriented Pain management: satisfactory to patient Vital Signs Assessment: post-procedure vital signs reviewed and stable Respiratory status: respiratory function stable Cardiovascular status: stable Postop Assessment: no headache, no backache, epidural receding, patient able to bend at knees, no signs of nausea or vomiting and adequate PO intake Anesthetic complications: no    Last Vitals:  Vitals:   08/09/18 0521 08/09/18 1449  BP: 123/70 (!) 157/96  Pulse: 62 72  Resp: 18 18  Temp: 36.7 C (!) 36.4 C  SpO2: 98%     Last Pain:  Vitals:   08/09/18 1449  TempSrc: Oral  PainSc:    Pain Goal: Patients Stated Pain Goal: 5 (08/08/18 0800)                 Karleen Dolphin

## 2018-08-10 MED ORDER — IBUPROFEN 600 MG PO TABS: 600.0000 mg | ORAL_TABLET | Freq: Four times a day (QID) | ORAL | 0 refills | Status: DC | PRN

## 2018-08-10 MED ORDER — TRIAMTERENE-HCTZ 37.5-25 MG PO TABS: 1.0000 | ORAL_TABLET | Freq: Every day | ORAL | 1 refills | Status: DC

## 2018-08-10 MED ORDER — TRIAMTERENE-HCTZ 37.5-25 MG PO TABS
1.0000 | ORAL_TABLET | Freq: Every day | ORAL | Status: DC
  Administered 2018-08-10: 1 via ORAL
  Filled 2018-08-10 (×2): qty 1

## 2018-08-10 NOTE — Lactation Note (Signed)
This note was copied from a baby's chart. Lactation Consultation Note  Patient Name: Carla Hammond WCHEN'I Date: 08/10/2018 Reason for consult: Follow-up assessment;Early term 37-38.6wks;Primapara;1st time breastfeeding  0903 - 0923 - I visited Carla Hammond to conduct discharge education. Carla Hammond was asleep with baby "Carla Hammond" in football hold on her left breast. Baby was also asleep. I woke mom up and offered to assist with moving baby to her bassinet.   Mom states that baby fed well overnight; she states that baby has wanted to be skin to skin constantly and fusses when removed. We discussed cluster feeding behavior and how this relates to mature milk production. I encouraged her to sleep when baby sleeps and encouraged her to move baby to her bassinet if she becomes too drowsy to stay awake.  I moved baby to bassinet and swaddled her. I retrieved a new cap for baby, and baby went to sleep.  We discussed how to manage engorgement, output expectations for the first week, and feeding frequency and duration. I reviewed our contact information and community breast feeding resources.  I asked mom to show me how to hand express on her right breast, and I noted colostrum. Her nipples appeared in tact and WNL, and she denies breast pain with latch.   Mom was extremely sleepy during this visit; I recommended rest. She plans to set up a follow up appointment with [facility] for tomorrow or the following day. I encouraged her to call them. No further questions at this time. Mom was falling asleep upon exit.  Maternal Data Has patient been taught Hand Expression?: Yes Does the patient have breastfeeding experience prior to this delivery?: No  Feeding    LATCH Score                   Interventions Interventions: Skin to skin;Hand express  Lactation Tools Discussed/Used     Consult Status Consult Status: Complete Follow-up type: Call as needed    Carla Hammond 08/10/2018, 9:27 AM

## 2018-08-10 NOTE — Discharge Instructions (Signed)
Vaginal Delivery, Care After °Refer to this sheet in the next few weeks. These instructions provide you with information about caring for yourself after vaginal delivery. Your health care provider may also give you more specific instructions. Your treatment has been planned according to current medical practices, but problems sometimes occur. Call your health care provider if you have any problems or questions. °What can I expect after the procedure? °After vaginal delivery, it is common to have: °· Some bleeding from your vagina. °· Soreness in your abdomen, your vagina, and the area of skin between your vaginal opening and your anus (perineum). °· Pelvic cramps. °· Fatigue. °Follow these instructions at home: °Medicines °· Take over-the-counter and prescription medicines only as told by your health care provider. °· If you were prescribed an antibiotic medicine, take it as told by your health care provider. Do not stop taking the antibiotic until it is finished. °Driving ° °· Do not drive or operate heavy machinery while taking prescription pain medicine. °· Do not drive for 24 hours if you received a sedative. °Lifestyle °· Do not drink alcohol. This is especially important if you are breastfeeding or taking medicine to relieve pain. °· Do not use tobacco products, including cigarettes, chewing tobacco, or e-cigarettes. If you need help quitting, ask your health care provider. °Eating and drinking °· Drink at least 8 eight-ounce glasses of water every day unless you are told not to by your health care provider. If you choose to breastfeed your baby, you may need to drink more water than this. °· Eat high-fiber foods every day. These foods may help prevent or relieve constipation. High-fiber foods include: °? Whole grain cereals and breads. °? Brown rice. °? Beans. °? Fresh fruits and vegetables. °Activity °· Return to your normal activities as told by your health care provider. Ask your health care provider what  activities are safe for you. °· Rest as much as possible. Try to rest or take a nap when your baby is sleeping. °· Do not lift anything that is heavier than your baby or 10 lb (4.5 kg) until your health care provider says that it is safe. °· Talk with your health care provider about when you can engage in sexual activity. This may depend on your: °? Risk of infection. °? Rate of healing. °? Comfort and desire to engage in sexual activity. °Vaginal Care °· If you have an episiotomy or a vaginal tear, check the area every day for signs of infection. Check for: °? More redness, swelling, or pain. °? More fluid or blood. °? Warmth. °? Pus or a bad smell. °· Do not use tampons or douches until your health care provider says this is safe. °· Watch for any blood clots that may pass from your vagina. These may look like clumps of dark red, brown, or black discharge. °General instructions °· Keep your perineum clean and dry as told by your health care provider. °· Wear loose, comfortable clothing. °· Wipe from front to back when you use the toilet. °· Ask your health care provider if you can shower or take a bath. If you had an episiotomy or a perineal tear during labor and delivery, your health care provider may tell you not to take baths for a certain length of time. °· Wear a bra that supports your breasts and fits you well. °· If possible, have someone help you with household activities and help care for your baby for at least a few days after you   leave the hospital. °· Keep all follow-up visits for you and your baby as told by your health care provider. This is important. °Contact a health care provider if: °· You have: °? Vaginal discharge that has a bad smell. °? Difficulty urinating. °? Pain when urinating. °? A sudden increase or decrease in the frequency of your bowel movements. °? More redness, swelling, or pain around your episiotomy or vaginal tear. °? More fluid or blood coming from your episiotomy or vaginal  tear. °? Pus or a bad smell coming from your episiotomy or vaginal tear. °? A fever. °? A rash. °? Little or no interest in activities you used to enjoy. °? Questions about caring for yourself or your baby. °· Your episiotomy or vaginal tear feels warm to the touch. °· Your episiotomy or vaginal tear is separating or does not appear to be healing. °· Your breasts are painful, hard, or turn red. °· You feel unusually sad or worried. °· You feel nauseous or you vomit. °· You pass large blood clots from your vagina. If you pass a blood clot from your vagina, save it to show to your health care provider. Do not flush blood clots down the toilet without having your health care provider look at them. °· You urinate more than usual. °· You are dizzy or light-headed. °· You have not breastfed at all and you have not had a menstrual period for 12 weeks after delivery. °· You have stopped breastfeeding and you have not had a menstrual period for 12 weeks after you stopped breastfeeding. °Get help right away if: °· You have: °? Pain that does not go away or does not get better with medicine. °? Chest pain. °? Difficulty breathing. °? Blurred vision or spots in your vision. °? Thoughts about hurting yourself or your baby. °· You develop pain in your abdomen or in one of your legs. °· You develop a severe headache. °· You faint. °· You bleed from your vagina so much that you fill two sanitary pads in one hour. °This information is not intended to replace advice given to you by your health care provider. Make sure you discuss any questions you have with your health care provider. °Document Released: 06/27/2000 Document Revised: 12/12/2015 Document Reviewed: 07/15/2015 °Elsevier Interactive Patient Education © 2019 [employer] ° °

## 2018-08-11 ENCOUNTER — Ambulatory Visit: Payer: Medicaid Other

## 2018-08-11 VITALS — BP 155/100 | HR 96

## 2018-08-11 DIAGNOSIS — O165 Unspecified maternal hypertension, complicating the puerperium: Secondary | ICD-10-CM

## 2018-08-11 MED ORDER — CARVEDILOL 12.5 MG PO TABS: 12.5000 mg | ORAL_TABLET | Freq: Two times a day (BID) | ORAL | 2 refills | Status: DC

## 2018-08-11 NOTE — Progress Notes (Signed)
Patient delivered SVD on 08/08/17. Pt currently prescribed maxide 37.5-25mg  once daily. BP elevated today, consulted with Dr. Clearance Coots who advises to add Coreg 12.5mg  twice daily to pts medication regimen and return in a week for another BP check. Pt verbalized understanding.

## 2018-08-18 ENCOUNTER — Ambulatory Visit (INDEPENDENT_AMBULATORY_CARE_PROVIDER_SITE_OTHER): Payer: Medicaid Other

## 2018-08-18 VITALS — BP 120/88 | HR 104 | Resp 18 | Ht 63.0 in | Wt 213.8 lb

## 2018-08-18 DIAGNOSIS — O165 Unspecified maternal hypertension, complicating the puerperium: Secondary | ICD-10-CM

## 2018-08-18 DIAGNOSIS — O133 Gestational [pregnancy-induced] hypertension without significant proteinuria, third trimester: Secondary | ICD-10-CM

## 2018-08-18 NOTE — Progress Notes (Signed)
Subjective:  Carla Hammond is a 23 y.o. female here for BP check. Patient reports no complaints since beginning hyperten sion medication: Coreg 12.5 mg orally twice daily with a meal and Maxzide-25 37.5- 25 mg orally daily.  Hypertension ROS: taking medications as instructed, no medication side effects noted, no TIA's, no chest pain on exertion, no dyspnea on exertion, no swelling of ankles and no palpitations.    Objective:  There were no vitals taken for this visit.  Appearance alert, well appearing, and in no distress, oriented to person, place, and time and well hydrated. General exam BP noted to be well controlled today in office.    Assessment:   Blood Pressure well controlled and improved.   Plan:  Current treatment plan is effective, no change in therapy.

## 2018-08-18 NOTE — Progress Notes (Deleted)
Subjective:  Carla Hammond is a 23 y.o. female here for BP check.   Hypertension ROS: {htn [facility] ros:315727::"taking medications as instructed","no medication side effects noted","no TIA's","no chest pain on exertion","no dyspnea on exertion","no swelling of ankles"}.    Objective:  There were no vitals taken for this visit.  Appearance {appearance:315021::"alert, well appearing, and in no distress"}. General exam BP noted to be well controlled today in office.    Assessment:   Blood Pressure {disease control degree:315147}.   Plan:  {disease follow up plans:315730}.

## 2018-08-18 NOTE — Progress Notes (Signed)
Agree with A & P. 

## 2018-09-06 ENCOUNTER — Ambulatory Visit (INDEPENDENT_AMBULATORY_CARE_PROVIDER_SITE_OTHER): Payer: Medicaid Other | Admitting: Advanced Practice Midwife

## 2018-09-06 ENCOUNTER — Encounter: Payer: Self-pay | Admitting: Advanced Practice Midwife

## 2018-09-06 DIAGNOSIS — Z1389 Encounter for screening for other disorder: Secondary | ICD-10-CM

## 2018-09-06 DIAGNOSIS — Z30430 Encounter for insertion of intrauterine contraceptive device: Secondary | ICD-10-CM

## 2018-09-06 DIAGNOSIS — Z3009 Encounter for other general counseling and advice on contraception: Secondary | ICD-10-CM

## 2018-09-06 DIAGNOSIS — O165 Unspecified maternal hypertension, complicating the puerperium: Secondary | ICD-10-CM

## 2018-09-06 MED ORDER — LEVONORGESTREL 20 MCG/24HR IU IUD
INTRAUTERINE_SYSTEM | Freq: Once | INTRAUTERINE | Status: AC
  Administered 2018-09-06: 14:00:00 via INTRAUTERINE

## 2018-09-06 NOTE — Patient Instructions (Signed)

## 2018-09-06 NOTE — Progress Notes (Signed)
Post Partum Exam  Carla Hammond is a 23 y.o. G21P1021 female who presents for a postpartum visit. She is 4 weeks postpartum following a spontaneous vaginal delivery. I have fully reviewed the prenatal and intrapartum course. The delivery was at 37 weeks and 2 days gestational weeks.  Anesthesia: epidural. Postpartum course has been normal. Baby's course has been normal. Baby is feeding by breast. Bleeding no bleeding. Bowel function is normal. Bladder function is normal. Patient is not sexually active. Contraception method is IUD. Postpartum depression screening:neg  The following portions of the patient's history were reviewed and updated as appropriate: allergies, current medications, past family history, past medical history, past social history, past surgical history and problem list. Last pap smear done 02/2018 and was Normal  Review of Systems Pertinent items noted in HPI and remainder of comprehensive ROS otherwise negative.    Objective:  Blood pressure (!) 146/91, pulse 64, weight 98.4 kg, currently breastfeeding.  VS reviewed, nursing note reviewed,  Constitutional: well developed, well nourished, no distress HEENT: normocephalic CV: normal rate Pulm/chest wall: normal effort Breast Exam:  right breast normal without mass, skin or nipple changes or axillary nodes, left breast normal without mass, skin or nipple changes or axillary nodes Abdomen: soft Neuro: alert and oriented x 3 Skin: warm, dry Psych: affect normal Pelvic exam: Cervix pink, visually closed, without lesion, scant white creamy discharge, vaginal walls and external genitalia normal, no suture visible with good approximation of laceration/repair  IUD Procedure Note Patient identified, informed consent performed.  Discussed risks of irregular bleeding, cramping, infection, malpositioning or misplacement of the IUD outside the uterus which may require further procedures. Time out was performed.  Urine pregnancy test  negative.  Speculum placed in the vagina.  Cervix visualized.  Cleaned with Betadine x 2.  Grasped anteriorly with a single tooth tenaculum.  Uterus sounded to 8 cm.  Mirena IUD placed per manufacturer's recommendations.  Strings trimmed to 3 cm. Tenaculum was removed, good hemostasis noted.  Patient tolerated procedure well.   Patient was given post-procedure instructions and the Mirena care card with expiration date.  Patient was also asked to check IUD strings periodically and follow up in 4-6 weeks for IUD check.     Assessment/Plan:   1. Postpartum care following vaginal delivery --Doing well  2. Encounter for initial insertion of intrauterine contraceptive device  - levonorgestrel (MIRENA) 20 MCG/24HR IUD  3. Hypertension in pregnancy, postpartum condition --BP elevated today, pt only taking Coreg but did not take this morning because she did not eat.   --Pt stopped Maxide 2 weeks ago --Take Coreg daily. BP check on Friday.  4. Encounter for counseling regarding contraception --Pt desires IUD.  Placed without difficulty. See above procedure note.    Social/emotional concerns:  none.  Contraception: IUD  Follow up in: 3 days for BP check and 4-6 weeks for IUD string check or as needed.   Sharen Counter, CNM 3:28 PM

## 2018-09-07 DIAGNOSIS — O165 Unspecified maternal hypertension, complicating the puerperium: Secondary | ICD-10-CM | POA: Insufficient documentation

## 2018-09-07 HISTORY — DX: Unspecified maternal hypertension, complicating the puerperium: O16.5

## 2018-09-10 ENCOUNTER — Ambulatory Visit: Payer: Medicaid Other

## 2018-09-17 ENCOUNTER — Ambulatory Visit: Payer: Medicaid Other

## 2018-09-20 ENCOUNTER — Encounter: Payer: Self-pay | Admitting: *Deleted

## 2018-09-20 ENCOUNTER — Ambulatory Visit: Payer: Medicaid Other | Admitting: *Deleted

## 2018-09-20 NOTE — Progress Notes (Signed)
Subjective:  Carla Hammond is a 23 y.o. female here for BP check.   Hypertension ROS: taking medications as instructed, no medication side effects noted, no TIA's, no chest pain on exertion, no dyspnea on exertion and no swelling of ankles.    Objective:  BP 132/89   Pulse 79   Appearance alert, well appearing, and in no distress. General exam BP noted to be well controlled today in office.    Assessment:   Blood Pressure stable.   Plan:  Current treatment plan is effective, no change in therapy. Advised to f/u with PCP for BP management.  Pt was given note for return to work and accommodations for breastfeeding/pumping. Pt has f/u here in office in 2 weeks.

## 2018-10-04 ENCOUNTER — Other Ambulatory Visit: Payer: Self-pay

## 2018-10-04 ENCOUNTER — Ambulatory Visit (INDEPENDENT_AMBULATORY_CARE_PROVIDER_SITE_OTHER): Payer: Medicaid Other | Admitting: Obstetrics and Gynecology

## 2018-10-04 DIAGNOSIS — Z30431 Encounter for routine checking of intrauterine contraceptive device: Secondary | ICD-10-CM

## 2018-10-04 NOTE — Progress Notes (Signed)
GYN/Televisit.  Reports no problems with strings.Marland Kitchen

## 2018-10-04 NOTE — Progress Notes (Signed)
    GYNECOLOGY OFFICE ENCOUNTER NOTE  History:  23 y.o. [id number] here today for today for IUD string check; Her visit was done today via televisit. She is not having any problems with her IUD. She had a normal period the first of march and she has had some light spotting, however no heavy bleeding. She is not having any pain. She is still taking her BP medication as prescribed and her last BP was normal.    Review of Systems:  Pertinent items are noted in HPI.   Objective:  Physical Exam currently breastfeeding.  Assessment & Plan:  Patient to keep IUD in place for up to 6 years; can come in for removal if she desires pregnancy earlier or for any concerning side effects. She is encouraged to call the office in the next few weeks to schedule an official string check.  Or call the office with any concerning s/s regarding her IUD   Isabela Nardelli, Harolyn Rutherford, NP [facility] [employer], [facility]

## 2018-10-26 DIAGNOSIS — R1030 Lower abdominal pain, unspecified: Secondary | ICD-10-CM | POA: Diagnosis not present

## 2018-10-26 DIAGNOSIS — R1033 Periumbilical pain: Secondary | ICD-10-CM | POA: Diagnosis not present

## 2018-10-26 DIAGNOSIS — N854 Malposition of uterus: Secondary | ICD-10-CM | POA: Diagnosis not present

## 2018-10-26 DIAGNOSIS — K439 Ventral hernia without obstruction or gangrene: Secondary | ICD-10-CM | POA: Diagnosis not present

## 2018-10-26 DIAGNOSIS — Z975 Presence of (intrauterine) contraceptive device: Secondary | ICD-10-CM | POA: Diagnosis not present

## 2018-11-29 ENCOUNTER — Other Ambulatory Visit: Payer: Self-pay

## 2018-11-29 ENCOUNTER — Encounter (HOSPITAL_COMMUNITY): Payer: Self-pay | Admitting: Emergency Medicine

## 2018-11-29 ENCOUNTER — Emergency Department (HOSPITAL_COMMUNITY)
Admission: EM | Admit: 2018-11-29 | Discharge: 2018-11-30 | Disposition: A | Discharge status: Home / Self Care | Payer: Medicaid Other | Attending: Emergency Medicine | Admitting: Emergency Medicine

## 2018-11-29 DIAGNOSIS — I10 Essential (primary) hypertension: Secondary | ICD-10-CM | POA: Diagnosis not present

## 2018-11-29 DIAGNOSIS — R109 Unspecified abdominal pain: Secondary | ICD-10-CM | POA: Diagnosis not present

## 2018-11-29 DIAGNOSIS — R52 Pain, unspecified: Secondary | ICD-10-CM | POA: Diagnosis not present

## 2018-11-29 DIAGNOSIS — J45909 Unspecified asthma, uncomplicated: Secondary | ICD-10-CM | POA: Diagnosis not present

## 2018-11-29 DIAGNOSIS — Z79899 Other long term (current) drug therapy: Secondary | ICD-10-CM | POA: Diagnosis not present

## 2018-11-29 DIAGNOSIS — R1111 Vomiting without nausea: Secondary | ICD-10-CM | POA: Diagnosis not present

## 2018-11-29 LAB — CBC
HCT: 39.9 % (ref 36.0–46.0)
Hemoglobin: 13 g/dL (ref 12.0–15.0)
MCH: 29.9 pg (ref 26.0–34.0)
MCHC: 32.6 g/dL (ref 30.0–36.0)
MCV: 91.7 fL (ref 80.0–100.0)
Platelets: 206 10*3/uL (ref 150–400)
RBC: 4.35 MIL/uL (ref 3.87–5.11)
RDW: 13.4 % (ref 11.5–15.5)
WBC: 9.7 10*3/uL (ref 4.0–10.5)
nRBC: 0 % (ref 0.0–0.2)

## 2018-11-29 LAB — URINALYSIS, ROUTINE W REFLEX MICROSCOPIC
Bilirubin Urine: NEGATIVE
Glucose, UA: NEGATIVE mg/dL
Hgb urine dipstick: NEGATIVE
Ketones, ur: NEGATIVE mg/dL
Leukocytes,Ua: NEGATIVE
Nitrite: NEGATIVE
Protein, ur: NEGATIVE mg/dL
Specific Gravity, Urine: 1.023 (ref 1.005–1.030)
pH: 7 (ref 5.0–8.0)

## 2018-11-29 LAB — LIPASE, BLOOD: Lipase: 38 U/L (ref 11–51)

## 2018-11-29 LAB — COMPREHENSIVE METABOLIC PANEL
ALT: 33 U/L (ref 0–44)
AST: 69 U/L — ABNORMAL HIGH (ref 15–41)
Albumin: 4.4 g/dL (ref 3.5–5.0)
Alkaline Phosphatase: 76 U/L (ref 38–126)
Anion gap: 9 (ref 5–15)
BUN: 17 mg/dL (ref 6–20)
CO2: 23 mmol/L (ref 22–32)
Calcium: 9.3 mg/dL (ref 8.9–10.3)
Chloride: 106 mmol/L (ref 98–111)
Creatinine, Ser: 0.84 mg/dL (ref 0.44–1.00)
GFR calc Af Amer: >60 mL/min (ref >60)
GFR calc non Af Amer: >60 mL/min (ref >60)
Glucose, Bld: 91 mg/dL (ref 70–99)
Potassium: 3.6 mmol/L (ref 3.5–5.1)
Sodium: 138 mmol/L (ref 135–145)
Total Bilirubin: 0.5 mg/dL (ref 0.3–1.2)
Total Protein: 8.2 g/dL — ABNORMAL HIGH (ref 6.5–8.1)

## 2018-11-29 LAB — I-STAT BETA HCG BLOOD, ED (MC, WL, AP ONLY): I-stat hCG, quantitative: <5 m[IU]/mL (ref <5)

## 2018-11-29 MED ORDER — SODIUM CHLORIDE 0.9% FLUSH: 3.0000 mL | Freq: Once | INTRAVENOUS | Status: DC

## 2018-11-29 MED ORDER — KETOROLAC TROMETHAMINE 60 MG/2ML IM SOLN
60.0000 mg | Freq: Once | INTRAMUSCULAR | Status: AC
  Administered 2018-11-30: 60 mg via INTRAMUSCULAR
  Filled 2018-11-29: qty 2

## 2018-11-29 NOTE — ED Triage Notes (Signed)
Pt reports having cramping abdominal pain across generalized abdomen. Pt reports 2 episodes of vomiting.

## 2018-11-29 NOTE — ED Provider Notes (Signed)
[facility]-EMERGENCY DEPT Provider Note   CSN: [id number] Arrival date & time: 11/29/18  1922    History   Chief Complaint Chief Complaint  Patient presents with  . Abdominal Pain    HPI Ladonne Lietzke is a 23 y.o. female.     The history is provided by the patient and medical records.    23 year old female with history of asthma, presenting to the ED for abdominal cramping.  States this started today after lunch, she ate pizza which does not have the ordinary for her.  States pain was throughout her entire abdomen and described as "cramping".  States she made herself throw up because she thought she has over eaten, but this did not improve.  States persisted throughout the afternoon.  States it feels like a really intense menstrual cramp.  She has had this once in the past and was told it was likely related to her IUD.  She had Mirena placed in February after the birth of her daughter.  States she has never had an IUD before.  She does have period, actually just had her menses last week that was normal.  She has not had any abnormal vaginal discharge or pelvic pain.  She is not had any urinary symptoms.  No fever or chills.  She did take some extra strength Tylenol which did not help at the time, but reports throughout the evening pain has started to decrease on its own.  Patient now states she is hungry and wants to eat.  Past Medical History:  Diagnosis Date  . Asthma   . Complication of anesthesia     Patient Active Problem List   Diagnosis Date Noted  . IUD check up 10/04/2018  . Hypertension, postpartum condition or complication 09/07/2018  . MRSA (methicillin resistant staph aureus) culture positive 08/08/2018  . 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 INDUCED ABORTION       OB History    Gravida  3   Para  1   Term  1   Preterm      AB  2   Living  1     SAB      TAB  2   Ectopic      Multiple      Live Births  1            Home Medications    Prior to Admission medications   Medication Sig Start Date End Date Taking? Authorizing Provider  albuterol (PROVENTIL HFA;VENTOLIN HFA) 108 (90 Base) MCG/ACT inhaler Inhale 2 puffs into the lungs every 6 (six) hours as needed for wheezing or shortness of breath.    [provider]  carvedilol (COREG) 12.5 MG tablet Take 1 tablet (12.5 mg total) by mouth 2 (two) times daily with a meal. 08/11/18   Brock Bad, MD  ibuprofen (ADVIL,MOTRIN) 600 MG tablet Take 1 tablet (600 mg total) by mouth every 6 (six) hours as needed. 08/10/18   Arabella Merles, CNM  Prenatal Vit-Fe Fumarate-FA (PRENATAL MULTIVITAMIN) TABS tablet Take 1 tablet by mouth daily at 12 noon.    [provider]  triamterene-hydrochlorothiazide (MAXZIDE-25) 37.5-25 MG tablet Take 1 tablet by mouth daily. 08/10/18   Arabella Merles, CNM    Family History Family History  Problem Relation Age of Onset  . Hypertension Mother   . Stroke Maternal Grandfather   . Heart disease Maternal Grandfather  Social History Social History   Tobacco Use  . Smoking status: Never Smoker  . Smokeless tobacco: Never Used  Substance Use Topics  . Alcohol use: Not Currently    Comment: occ when not preg  . Drug use: Never     Allergies   Patient has no known allergies.   Review of Systems Review of Systems  Gastrointestinal: Positive for abdominal pain.  All other systems reviewed and are negative.    Physical Exam Updated Vital Signs BP (!) 159/87 (BP Location: Left Arm)   Pulse 66   Temp 98.9 F (37.2 C) (Oral)   Resp 18   Ht 5\' 4"  (1.626 m)   Wt 90.7 kg   LMP 11/15/2018   SpO2 100%   BMI 34.33 kg/m   Physical Exam Vitals signs and nursing note reviewed.  Constitutional:      Appearance: She is well-developed.     Comments: Watching movie on phone, NAD  HENT:     Head: Normocephalic and atraumatic.  Eyes:      Conjunctiva/sclera: Conjunctivae normal.     Pupils: Pupils are equal, round, and reactive to light.  Neck:     Musculoskeletal: Normal range of motion.  Cardiovascular:     Rate and Rhythm: Normal rate and regular rhythm.     Heart sounds: Normal heart sounds.  Pulmonary:     Effort: Pulmonary effort is normal.     Breath sounds: Normal breath sounds.  Abdominal:     General: Bowel sounds are normal.     Palpations: Abdomen is soft.     Tenderness: There is no abdominal tenderness. There is no guarding or rebound.     Comments: Soft, non-tender  Musculoskeletal: Normal range of motion.  Skin:    General: Skin is warm and dry.  Neurological:     Mental Status: She is alert and oriented to person, place, and time.      ED Treatments / Results  Labs (all labs ordered are listed, but only abnormal results are displayed) Labs Reviewed  COMPREHENSIVE METABOLIC PANEL - Abnormal; Notable for the following components:      Result Value   Total Protein 8.2 (*)    AST 69 (*)    All other components within normal limits  URINALYSIS, ROUTINE W REFLEX MICROSCOPIC - Abnormal; Notable for the following components:   APPearance HAZY (*)    All other components within normal limits  LIPASE, BLOOD  CBC  I-STAT BETA HCG BLOOD, ED ([facility], [facility], [facility] ONLY)    EKG None  Radiology No results found.  Procedures Procedures (including critical care time)  Medications Ordered in ED Medications  ketorolac (TORADOL) injection 60 mg (60 mg Intramuscular Given 11/30/18 0008)     Initial Impression / Assessment and Plan / ED Course  I have reviewed the triage vital signs and the nursing notes.  Pertinent labs & imaging results that were available during my care of the patient were reviewed by me and considered in my medical decision making (see chart for details).  23 year old female here with generalized abdominal cramping.  States onset today around noon after eating lunch.  She ate pizza  which is not out of the ordinary for her.  States pain throughout her abdomen and feels like a "really intense menstrual cramp".  She does have history of similar symptoms in the past and was told it was related to her IUD.  She does have Mirena in place for about 3 months, she  is never had IUD prior to this.  Pain has been dissipating gradually since onset.  She is afebrile and nontoxic.  Her abdomen is soft and benign without any apparent distention or peritoneal signs.  Her bowel sounds are normal.  Screening labs are obtained and are overall reassuring.  Patient given dose of Toradol here with improvement.  She just had her menses last week that was reportedly normal.  At this point, I do not feel she needs any further emergent evaluation to include imaging or other laboratory testing.  I think she can follow-up with her OB/GYN if symptoms persist.  Rx motrin .  Return here for any new/acute changes.  Final Clinical Impressions(s) / ED Diagnoses   Final diagnoses:  Abdominal cramping    ED Discharge Orders         Ordered    ibuprofen (ADVIL) 800 MG tablet  3 times daily     11/30/18 0033           Garlon Hatchet, PA-C 11/30/18 1610    Linwood Dibbles, MD 12/02/18 [phone]

## 2018-11-30 MED ORDER — IBUPROFEN 800 MG PO TABS: 800.0000 mg | ORAL_TABLET | Freq: Three times a day (TID) | ORAL | 0 refills | Status: DC

## 2018-11-30 NOTE — Discharge Instructions (Signed)
Take the prescribed medication as directed.  Can also try heating pad, warm shower/bath, etc. I would follow-up with your OB-GYN if ongoing issues. Return to the ED for new or worsening symptoms.

## 2019-02-11 ENCOUNTER — Ambulatory Visit: Payer: Medicaid Other | Admitting: Obstetrics and Gynecology

## 2019-02-11 ENCOUNTER — Other Ambulatory Visit: Payer: Self-pay

## 2019-02-11 VITALS — BP 143/93 | HR 83 | Wt 232.0 lb

## 2019-02-11 DIAGNOSIS — R102 Pelvic and perineal pain: Secondary | ICD-10-CM | POA: Diagnosis not present

## 2019-02-11 DIAGNOSIS — Z975 Presence of (intrauterine) contraceptive device: Secondary | ICD-10-CM

## 2019-02-11 DIAGNOSIS — Z30431 Encounter for routine checking of intrauterine contraceptive device: Secondary | ICD-10-CM

## 2019-02-11 NOTE — Progress Notes (Signed)
Pt states she is having severe pain since IUD placement.  Pt states pain is close to her cycles time.   Pt states that she is still having cycles, it is lighter. Pt states she has missed work due to pain.

## 2019-02-11 NOTE — Progress Notes (Signed)
    GYNECOLOGY OFFICE ENCOUNTER NOTE  History:  23 y.o. [id number] here today for today for IUD string check; Mirena  IUD was placed 09/06/18 . Since she had her IUD place she has been having pain following her menstrual cycle. Her menstrual cycle is normal and comes every month, however when her cycle ends she has intense pain that does not respond to ibuprofen. Her cycles are lighter since the IUD was placed.   Review of Systems:  Pertinent items are noted in HPI.   Objective:  Physical Exam Blood pressure (!) 143/93, pulse 83, weight 232 lb (105.2 kg), currently breastfeeding. CONSTITUTIONAL: Well-developed, well-nourished female in no acute distress.  HENT:  Normocephalic, atraumatic. External right and left ear normal. Oropharynx is clear and moist EYES: Conjunctivae and EOM are normal. Pupils are equal, round, and reactive to light. No scleral icterus.  NECK: Normal range of motion, supple, no masses CARDIOVASCULAR: Normal heart rate noted RESPIRATORY: Effort and breath sounds normal, no problems with respiration noted ABDOMEN: Soft, no distention noted.   PELVIC: Normal appearing external genitalia; normal appearing vaginal mucosa and cervix.  IUD strings visualized, about 3 cm in length outside cervix.   Assessment & Plan:   1. Pelvic pain  - US Pelvis Complete; Future - Will f/u in the office following [place]   2. IUD (intrauterine device) in place  - US Pelvis Complete; Future    Rasch, Artist Pais, [place] for [employer], [place]

## 2019-02-24 ENCOUNTER — Ambulatory Visit (HOSPITAL_COMMUNITY): Admission: RE | Admit: 2019-02-24 | Payer: Medicaid Other | Source: Ambulatory Visit

## 2019-03-01 ENCOUNTER — Other Ambulatory Visit: Payer: Self-pay

## 2019-03-01 DIAGNOSIS — Z20822 Contact with and (suspected) exposure to covid-19: Secondary | ICD-10-CM

## 2019-03-02 LAB — NOVEL CORONAVIRUS, NAA: SARS-CoV-2, NAA: NOT DETECTED

## 2019-03-07 ENCOUNTER — Other Ambulatory Visit: Payer: Self-pay

## 2019-03-07 ENCOUNTER — Ambulatory Visit (HOSPITAL_COMMUNITY)
Admission: RE | Admit: 2019-03-07 | Discharge: 2019-03-07 | Disposition: A | Discharge status: Home / Self Care | Payer: Medicaid Other | Source: Ambulatory Visit | Attending: Obstetrics and Gynecology | Admitting: Obstetrics and Gynecology

## 2019-03-07 DIAGNOSIS — R102 Pelvic and perineal pain: Secondary | ICD-10-CM | POA: Insufficient documentation

## 2019-03-07 NOTE — Progress Notes (Signed)
US order

## 2019-03-10 ENCOUNTER — Ambulatory Visit: Payer: Medicaid Other | Admitting: Certified Nurse Midwife

## 2019-05-15 ENCOUNTER — Ambulatory Visit (HOSPITAL_COMMUNITY)
Admission: EM | Admit: 2019-05-15 | Discharge: 2019-05-15 | Disposition: A | Discharge status: Home / Self Care | Payer: Medicaid Other | Attending: Internal Medicine | Admitting: Internal Medicine

## 2019-05-15 ENCOUNTER — Other Ambulatory Visit: Payer: Self-pay

## 2019-05-15 ENCOUNTER — Encounter (HOSPITAL_COMMUNITY): Payer: Self-pay | Admitting: *Deleted

## 2019-05-15 DIAGNOSIS — T8384XA Pain from genitourinary prosthetic devices, implants and grafts, initial encounter: Secondary | ICD-10-CM | POA: Diagnosis not present

## 2019-05-15 DIAGNOSIS — Z3202 Encounter for pregnancy test, result negative: Secondary | ICD-10-CM

## 2019-05-15 LAB — POCT PREGNANCY, URINE: Preg Test, Ur: NEGATIVE

## 2019-05-15 NOTE — ED Provider Notes (Signed)
[facility]    CSN: [id number] Arrival date & time: 05/15/19  1041      History   Chief Complaint Chief Complaint  Patient presents with  . Abdominal Pain    HPI Carla Hammond is a 23 y.o. female with history of asthma-controlled comes to urgent care with mid to lower abdominal pain started today.  Patient describes pain as cramping.  No known aggravating factors.  He has tried ibuprofen with no improvement.  No dysuria, urgency or frequency.  Patient started having these intermittent crampy abdominal pains since she had her IUD placed in February 2020.  No fever or chills.  No nausea or vomiting.  Patient is sexually active her last menstrual period was 05/01/2019.  HPI  Past Medical History:  Diagnosis Date  . Asthma   . Complication of anesthesia     Patient Active Problem List   Diagnosis Date Noted  . IUD check up 10/04/2018  . Hypertension, postpartum condition or complication 09/07/2018  . MRSA (methicillin resistant staph aureus) culture positive 08/08/2018  . 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 INDUCED ABORTION      OB History    Gravida  3   Para  1   Term  1   Preterm      AB  2   Living  1     SAB      TAB  2   Ectopic      Multiple      Live Births  1            Home Medications    Prior to Admission medications   Medication Sig Start Date End Date Taking? Authorizing Provider  albuterol (PROVENTIL HFA;VENTOLIN HFA) 108 (90 Base) MCG/ACT inhaler Inhale 2 puffs into the lungs every 6 (six) hours as needed for wheezing or shortness of breath.    [provider]  carvedilol (COREG) 12.5 MG tablet Take 1 tablet (12.5 mg total) by mouth 2 (two) times daily with a meal. Patient not taking: Reported on 02/11/2019 08/11/18   Brock Bad, MD  ibuprofen (ADVIL) 800 MG tablet Take 1 tablet (800 mg total) by mouth 3 (three) times daily. Patient not taking:  Reported on 02/11/2019 11/30/18   Garlon Hatchet, PA-C  Prenatal Vit-Fe Fumarate-FA (PRENATAL MULTIVITAMIN) TABS tablet Take 1 tablet by mouth daily at 12 noon.    [provider]  triamterene-hydrochlorothiazide (MAXZIDE-25) 37.5-25 MG tablet Take 1 tablet by mouth daily. Patient not taking: Reported on 02/11/2019 08/10/18   Arabella Merles, CNM    Family History Family History  Problem Relation Age of Onset  . Hypertension Mother   . Stroke Maternal Grandfather   . Heart disease Maternal Grandfather     Social History Social History   Tobacco Use  . Smoking status: Never Smoker  . Smokeless tobacco: Never Used  Substance Use Topics  . Alcohol use: Yes    Comment: occasionally  . Drug use: Never     Allergies   Patient has no known allergies.   Review of Systems Review of Systems  Constitutional: Negative.   HENT: Negative.   Respiratory: Negative.   Cardiovascular: Negative.   Gastrointestinal: Positive for abdominal pain. Negative for diarrhea, nausea and vomiting.  Genitourinary: Negative for dyspareunia, genital sores, menstrual problem, vaginal discharge and vaginal pain.  Musculoskeletal: Negative for arthralgias, joint swelling and myalgias.  Skin: Negative.   Neurological:  Negative.      Physical Exam Triage Vital Signs ED Triage Vitals  Enc Vitals Group     BP 05/15/19 1053 (!) 142/80     Pulse Rate 05/15/19 1053 91     Resp 05/15/19 1053 18     Temp 05/15/19 1053 97.9 F (36.6 C)     Temp Source 05/15/19 1053 Other     SpO2 05/15/19 1053 97 %     Weight --      Height --      Head Circumference --      Peak Flow --      Pain Score 05/15/19 1052 8     Pain Loc --      Pain Edu? --      Excl. in GC? --    No data found.  Updated Vital Signs BP (!) 142/80   Pulse 91   Temp 97.9 F (36.6 C) (Other (Comment))   Resp 18   LMP 05/01/2019 (Approximate)   SpO2 97%   Breastfeeding No   Visual Acuity Right Eye Distance:   Left Eye  Distance:   Bilateral Distance:    Right Eye Near:   Left Eye Near:    Bilateral Near:     Physical Exam Constitutional:      General: She is not in acute distress.    Appearance: She is obese. She is not ill-appearing or toxic-appearing.  Cardiovascular:     Rate and Rhythm: Normal rate and regular rhythm.  Pulmonary:     Effort: Pulmonary effort is normal.     Breath sounds: Normal breath sounds.  Abdominal:     General: Abdomen is protuberant. There is no distension or abdominal bruit. There are no signs of injury.     Palpations: Abdomen is soft. There is no shifting dullness, hepatomegaly or splenomegaly.     Tenderness: There is abdominal tenderness in the suprapubic area.     Hernia: No hernia is present.  Skin:    General: Skin is warm.     Capillary Refill: Capillary refill takes less than 2 seconds.     Coloration: Skin is not mottled or pale.     Findings: No erythema.  Neurological:     General: No focal deficit present.     Mental Status: She is alert and oriented to person, place, and time.      UC Treatments / Results  Labs (all labs ordered are listed, but only abnormal results are displayed) Labs Reviewed  POC URINE PREG, ED  POCT PREGNANCY, URINE    EKG   Radiology No results found.  Procedures Procedures (including critical care time)  Medications Ordered in UC Medications - No data to display  Initial Impression / Assessment and Plan / UC Course  I have reviewed the triage vital signs and the nursing notes.  Pertinent labs & imaging results that were available during my care of the patient were reviewed by me and considered in my medical decision making (see chart for details).     1.  Crampy abdominal pain related to IUD: Patient is advised to take Tylenol/Motrin as needed for abdominal cramping. Abdominal exam is benign. Pregnancy test is negative, point-of-care urinalysis is negative for leukocyte esterase, blood, protein,  bacteria. Patient is advised to follow-up with GYN for further evaluation regarding IUD and abdominal cramps related to the IUD.   Final Clinical Impressions(s) / UC Diagnoses   Final diagnoses:  Pain due to intrauterine contraceptive device (IUD),  initial encounter [facility])   Discharge Instructions   None    ED Prescriptions    None     PDMP not reviewed this encounter.   Chase Picket, MD 05/15/19 [phone]

## 2019-05-15 NOTE — ED Triage Notes (Signed)
C/O intermittent mid abd pain since Feb when IUD was placed.  States same pain started this AM and also radiates into right lower back.  Denies any urinary sxs, vaginal discharge, fever, n/v/d.

## 2019-05-16 ENCOUNTER — Telehealth: Payer: Self-pay

## 2019-05-16 LAB — POCT URINALYSIS DIP (DEVICE)
Bilirubin Urine: NEGATIVE
Glucose, UA: NEGATIVE mg/dL
Ketones, ur: NEGATIVE mg/dL
Nitrite: NEGATIVE
Protein, ur: NEGATIVE mg/dL
Specific Gravity, Urine: >=1.03 (ref 1.005–1.030)
Urobilinogen, UA: 0.2 mg/dL (ref 0.0–1.0)
pH: 5.5 (ref 5.0–8.0)

## 2019-05-16 NOTE — Telephone Encounter (Signed)
Hi Carla Hammond, Please discuss with the Doctor at your appt 05/17/2019 and request letter to cover 11/2 - 09/2018 time taken off work.

## 2019-05-17 ENCOUNTER — Ambulatory Visit: Payer: Medicaid Other

## 2019-05-17 ENCOUNTER — Encounter: Payer: Self-pay | Admitting: Obstetrics

## 2019-05-17 ENCOUNTER — Other Ambulatory Visit: Payer: Self-pay

## 2019-05-17 ENCOUNTER — Other Ambulatory Visit (HOSPITAL_COMMUNITY)
Admission: RE | Admit: 2019-05-17 | Discharge: 2019-05-17 | Disposition: A | Discharge status: Home / Self Care | Payer: Medicaid Other | Source: Ambulatory Visit

## 2019-05-17 VITALS — BP 138/86 | HR 92 | Wt 246.0 lb

## 2019-05-17 DIAGNOSIS — R102 Pelvic and perineal pain: Secondary | ICD-10-CM | POA: Insufficient documentation

## 2019-05-17 DIAGNOSIS — Z975 Presence of (intrauterine) contraceptive device: Secondary | ICD-10-CM

## 2019-05-17 MED ORDER — NAPROXEN 500 MG PO TABS: ORAL_TABLET | ORAL | 1 refills | Status: DC

## 2019-05-17 NOTE — Patient Instructions (Signed)
Abdominal Pain, Adult Abdominal pain can be caused by many things. Often, abdominal pain is not serious and it gets better with no treatment or by being treated at home. However, sometimes abdominal pain is serious. Your health care provider will do a medical history and a physical exam to try to determine the cause of your abdominal pain. Follow these instructions at home:  Take over-the-counter and prescription medicines only as told by your health care provider. Do not take a laxative unless told by your health care provider.  Drink enough fluid to keep your urine clear or pale yellow.  Watch your condition for any changes.  Keep all follow-up visits as told by your health care provider. This is important. Contact a health care provider if:  Your abdominal pain changes or gets worse.  You are not hungry or you lose weight without trying.  You are constipated or have diarrhea for more than 2-3 days.  You have pain when you urinate or have a bowel movement.  Your abdominal pain wakes you up at night.  Your pain gets worse with meals, after eating, or with certain foods.  You are throwing up and cannot keep anything down.  You have a fever. Get help right away if:  Your pain does not go away as soon as your health care provider told you to expect.  You cannot stop throwing up.  Your pain is only in areas of the abdomen, such as the right side or the left lower portion of the abdomen.  You have bloody or black stools, or stools that look like tar.  You have severe pain, cramping, or bloating in your abdomen.  You have signs of dehydration, such as: ? Dark urine, very little urine, or no urine. ? Cracked lips. ? Dry mouth. ? Sunken eyes. ? Sleepiness. ? Weakness. This information is not intended to replace advice given to you by your health care provider. Make sure you discuss any questions you have with your health care provider. Document Released: 04/09/2005 Document  Revised: 01/18/2016 Document Reviewed: 12/12/2015 Elsevier Interactive Patient Education  2020 [employer]  

## 2019-05-17 NOTE — Progress Notes (Signed)
GYNECOLOGY PROBLEM OFFICE VISIT NOTE  History:  Carla Hammond is a 23 y.o. [phone] here today for abdominal pain and cramping in the setting of Mirena IUD. Patient states she has been experiencing intermittent pain that is a 7/10 since insertion of her Mirena in Feb 20202.  She states she continues to have light menstrual cycles, but the cramping is not in conjunction with her cycles.  She reports that she regularly feels the strings (daily) and had an [place] in Sept that showed proper placement in the uterus.  She reports using up to 1200mg  of Ibuprofen when experiencing the pain with no relief of her symptoms.  She states the pain is so unbearable that it interferes with work and care of her child.  She denies any abnormal vaginal discharge, bleeding, pelvic pain or pain/discomfort during sexual activity.  She reports having used depo provera and pills in the past for birth control methods, but was unsatisfied with them.     Past Medical History:  Diagnosis Date  . Asthma   . Complication of anesthesia     Past Surgical History:  Procedure Laterality Date  . INDUCED ABORTION      The following portions of the patient's history were reviewed and updated as appropriate: allergies, current medications, past family history, past medical history, past social history, past surgical history and problem list.   Health Maintenance:  Normal pap and negative HRHPV on 03/11/2018.  No mammogram d/t history.   Review of Systems:  Genito-Urinary ROS: no dysuria, trouble voiding, or hematuria   Objective:  Vitals: BP 138/86   Pulse 92   Wt 246 lb (111.6 kg)   LMP 05/01/2019 (Approximate)   BMI 42.23 kg/m   Physical Exam: Physical Exam Constitutional:      Appearance: She is obese.  Genitourinary:     No cervical motion tenderness.     Genitourinary Comments: CV collected via blind swab.  BME: No Tenderness noted.  IUD strings palpated.   HENT:     Head: Normocephalic and atraumatic.   Eyes:     Conjunctiva/sclera: Conjunctivae normal.  Pulmonary:     Effort: Pulmonary effort is normal.  Neurological:     Mental Status: She is alert and oriented to person, place, and time.  Skin:    General: Skin is warm and dry.  Psychiatric:        Mood and Affect: Mood normal.        Behavior: Behavior normal.        Thought Content: Thought content normal.      Labs and Imaging: No results found.   Assessment & Plan:  23 year old Pelvic Pain Mirena IUD  -Intially patient requesting removal of IUD, but declines other birth control methods. -Discussed how removal today could result in pregnancy if no birth control used in past 72 hours and in future sexual encounters.  -She then agrees to depo provera, but states she may consider Mirena IUD insertion in future if depo provera not wanted. -Discussed how insurance may not approve for reinsertion of another Mirena IUD with early removal and no negative findings regarding placement [place]. -Extensive discussion regarding pain management and patient agreeable to trial and error phase of medications. -Patient declines muscle relaxants and narcotics due to care of her infant. -Dr. Johnney Ou consulted who recommends changing to Naproxen 500mg  BID as needed. -In room to discuss with patient who is agreeable. Rx sent. -Patient encouraged to try trial for 2 weeks and  call if symptoms not responsive. -Encouraged to consider what she wants and can tolerate from birth control methods. -Contraception choices information sheets given -CV collected and sent. -Patient to follow up as needed.   Total face-to-face time with patient: 25 minutes   Gerrit Heck, [place] 05/17/2019 4:52 PM

## 2019-05-17 NOTE — Progress Notes (Signed)
RGYN patient presents for problem visit pt   c/o  Patient  states IUD is causing pain 7/10x pt notes cramping all the time  No relief w/ Rx Pt denies any irregular bleeding. No dysuria.

## 2019-05-18 DIAGNOSIS — R102 Pelvic and perineal pain: Secondary | ICD-10-CM | POA: Insufficient documentation

## 2019-05-18 DIAGNOSIS — Z975 Presence of (intrauterine) contraceptive device: Secondary | ICD-10-CM | POA: Insufficient documentation

## 2019-05-19 LAB — CERVICOVAGINAL ANCILLARY ONLY
Bacterial Vaginitis (gardnerella): POSITIVE — AB
Candida Glabrata: NEGATIVE
Candida Vaginitis: NEGATIVE
Chlamydia: NEGATIVE
Comment: NEGATIVE
Comment: NEGATIVE
Comment: NEGATIVE
Comment: NEGATIVE
Comment: NEGATIVE
Comment: NORMAL
Neisseria Gonorrhea: NEGATIVE
Trichomonas: NEGATIVE

## 2019-05-20 ENCOUNTER — Other Ambulatory Visit (HOSPITAL_COMMUNITY): Payer: Self-pay

## 2019-05-20 MED ORDER — METRONIDAZOLE 500 MG PO TABS: 500.0000 mg | ORAL_TABLET | Freq: Two times a day (BID) | ORAL | 0 refills | Status: DC

## 2019-07-24 DIAGNOSIS — Z20828 Contact with and (suspected) exposure to other viral communicable diseases: Secondary | ICD-10-CM | POA: Diagnosis not present

## 2019-08-01 IMAGING — US US OB < 14 WEEKS - US OB TV
1 series · 15 of 28 positions shown · non-contrast
Comparison: None.

CLINICAL DATA: Initial evaluation for low back pain with right
lower quadrant pain for 1 day. Early pregnancy.

EXAM:
OBSTETRIC <14 WK US AND TRANSVAGINAL OB US
TECHNIQUE: Both transabdominal and transvaginal ultrasound examinations were
performed for complete evaluation of the gestation as well as the
maternal uterus, adnexal regions, and pelvic cul-de-sac.
Transvaginal technique was performed to assess early pregnancy.

[Series 1: us ob < 14 weeks - us ob tv · 15 of 66 slices shown]
[im 1/66]
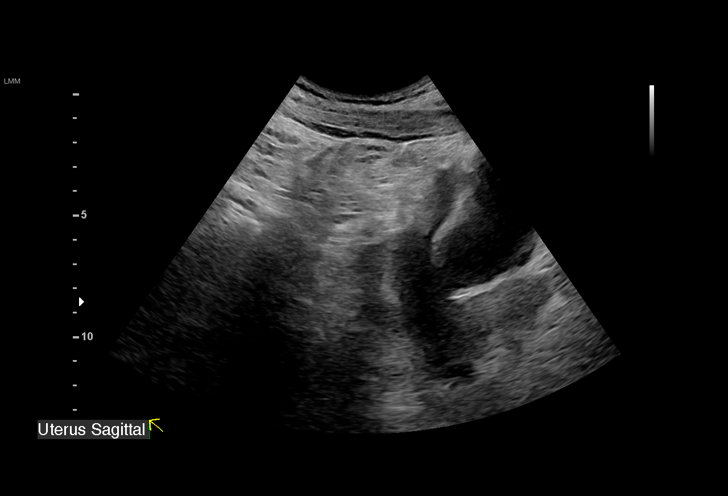
[im 5/66]
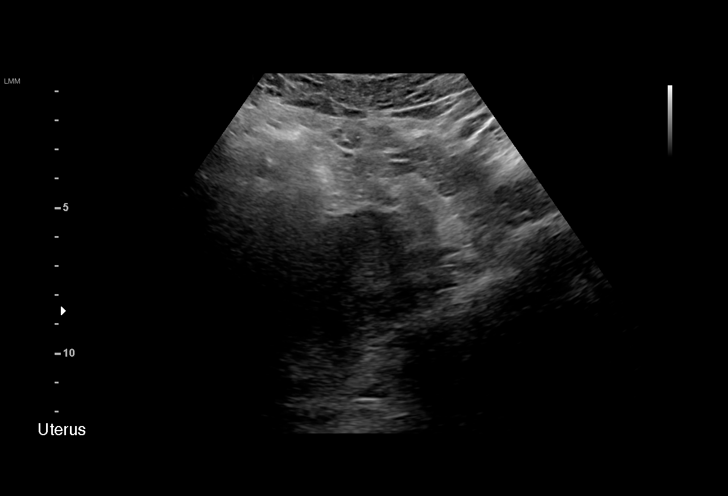
[im 10/66]
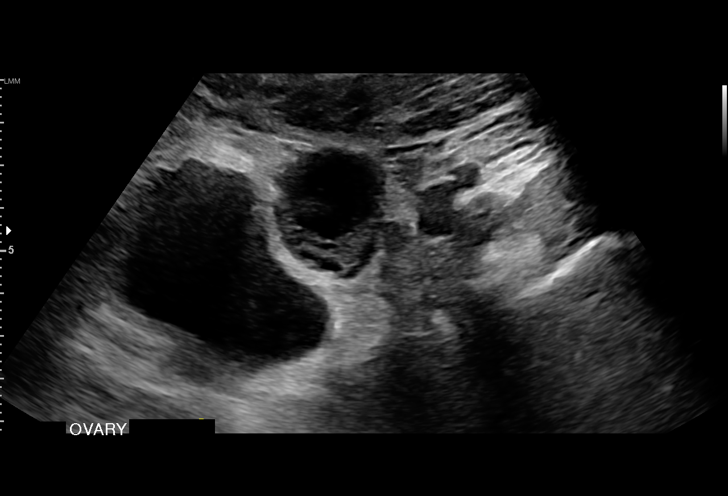
[im 15/66]
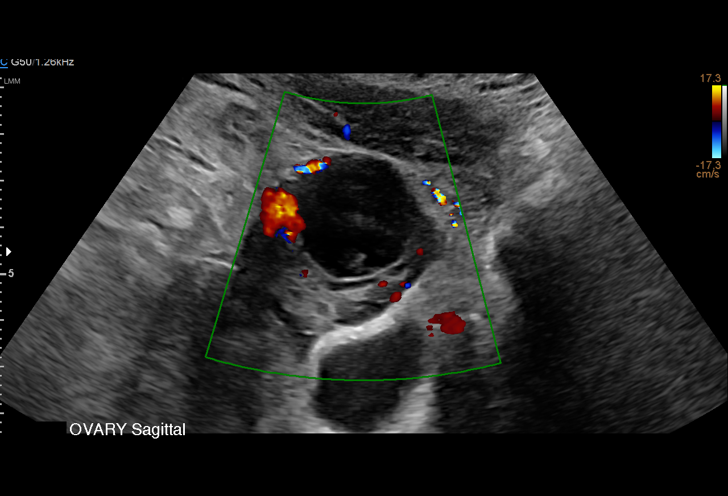
[im 20/66]
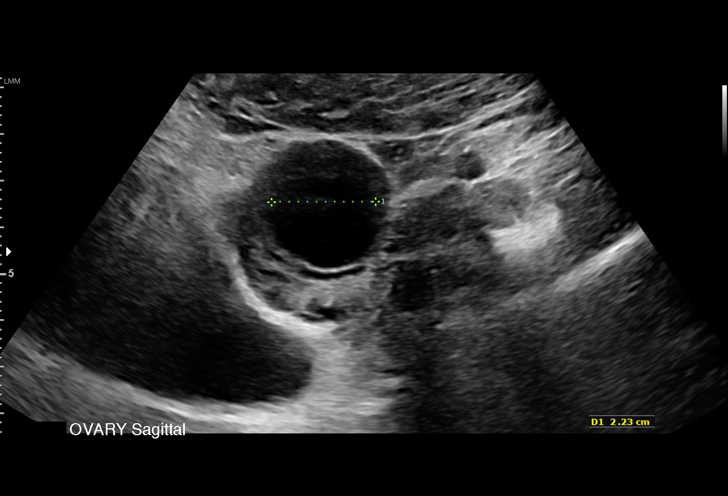
[im 25/66]
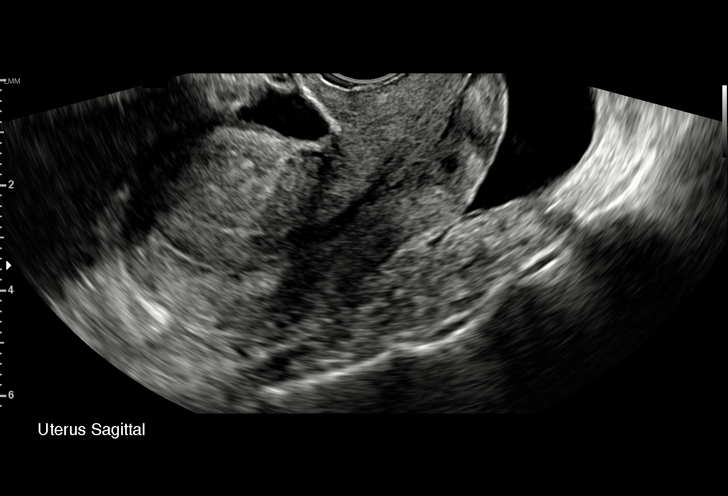
[im 29/66]
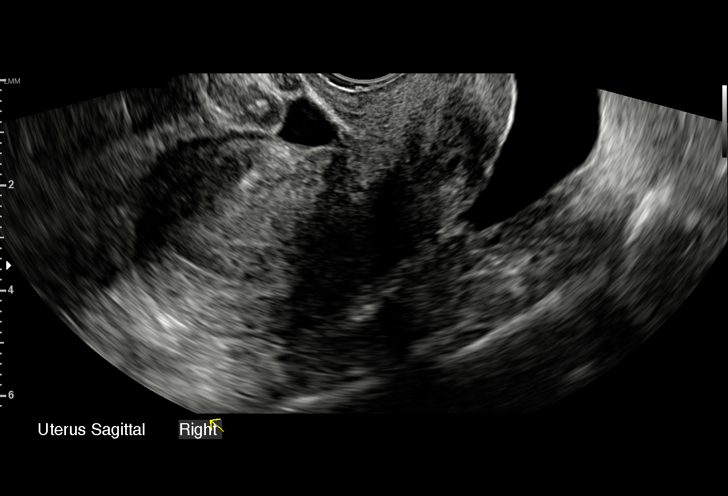
[im 34/66]
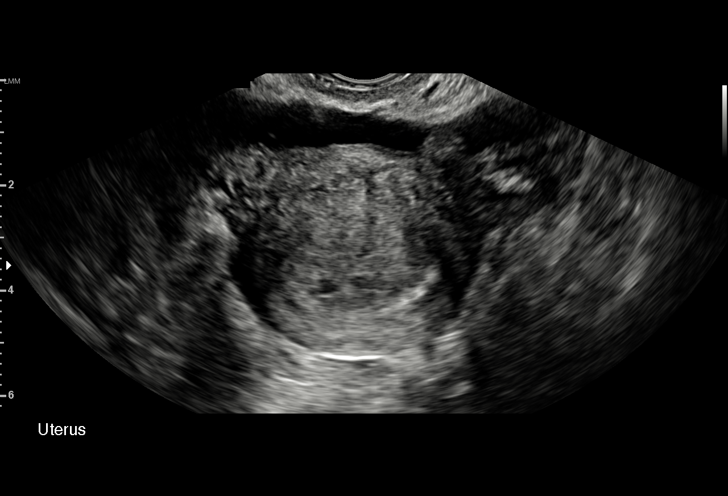
[im 37/66]
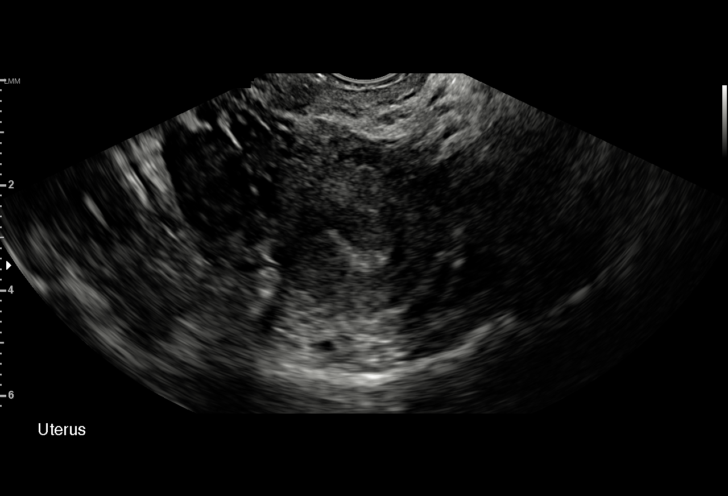
[im 41/66]
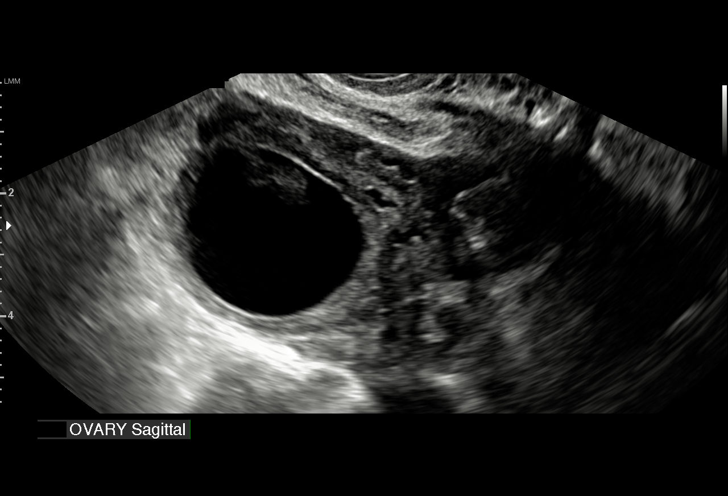
[im 46/66]
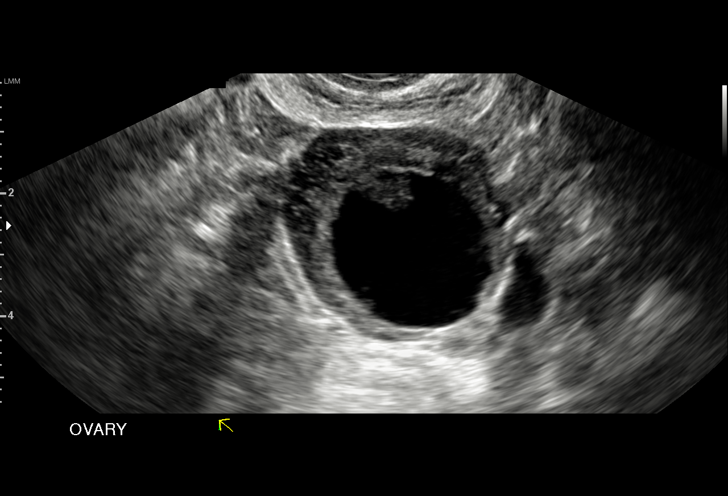
[im 51/66]
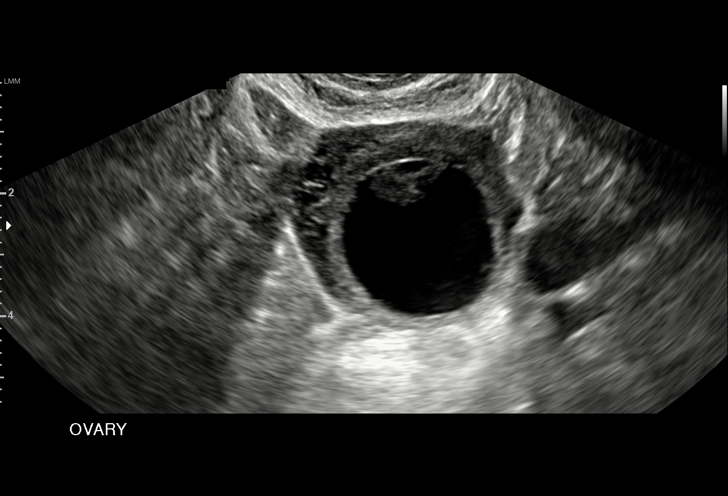
[im 56/66]
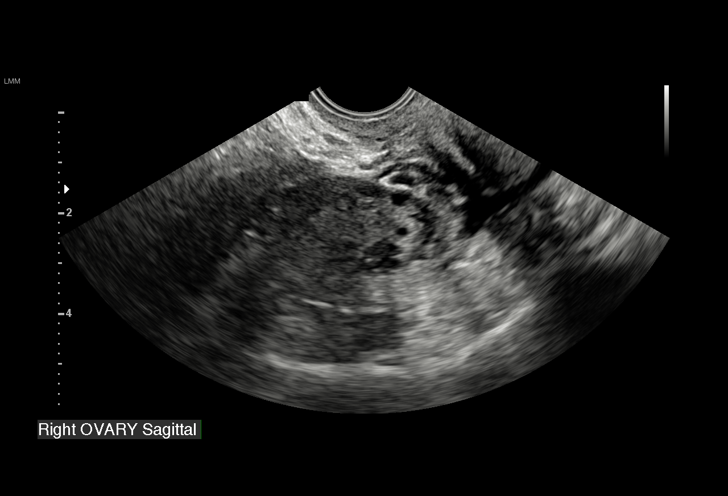
[im 61/66]
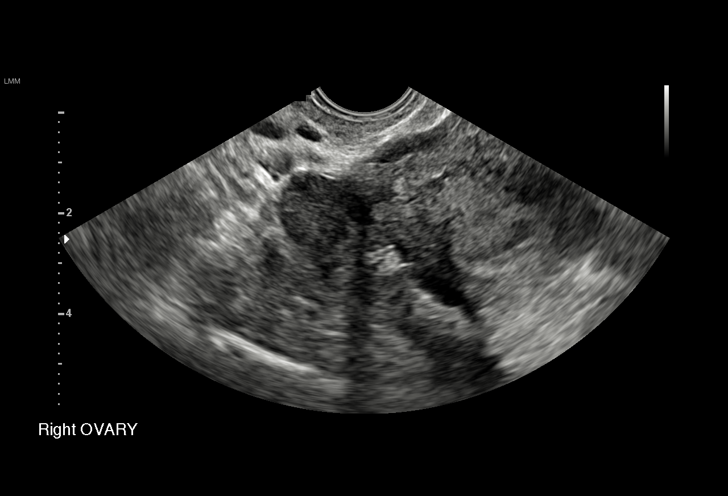
[im 66/66]
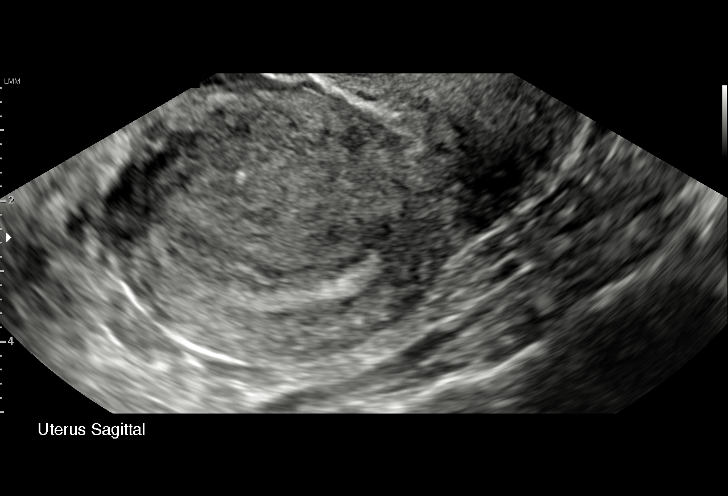

[15 of 28 positions shown; findings below may reference images not displayed]

FINDINGS: Intrauterine gestational sac: Not visualized.

Yolk sac:  Absent.

Embryo:  Absent.

Cardiac Activity: N/A

Heart Rate: N/A  bpm

Subchorionic hemorrhage:  None visualized.

Maternal uterus/adnexae: Right ovary unremarkable. 2.9 x 2.6 x
cm probable corpus luteal cyst noted within the left ovary. Small
volume free physiologic fluid within the pelvis.
IMPRESSION: 1. Early pregnancy with no discrete IUP or adnexal mass identified.
Finding is consistent with a pregnancy of unknown anatomic location.
Differential considerations include IUP to early to visualize,
recent SAB, or possibly occult ectopic pregnancy. Close clinical
monitoring with serial beta HCGs and close interval follow-up
ultrasound recommended as clinically warranted.
2. 2.9 cm left ovarian corpus luteal cyst with small volume free
physiologic fluid within the pelvis.

## 2019-08-11 IMAGING — US US OB TRANSVAGINAL
1 series · 15 of 28 positions shown · non-contrast
Comparison: None.

CLINICAL DATA: Pregnancy of unknown location

EXAM:
TRANSVAGINAL OB ULTRASOUND
TECHNIQUE: Transvaginal ultrasound was performed for complete evaluation of the
gestation as well as the maternal uterus, adnexal regions, and
pelvic cul-de-sac.

[Series 1: us ob transvaginal · 15 of 61 slices shown]
[im 1/61]
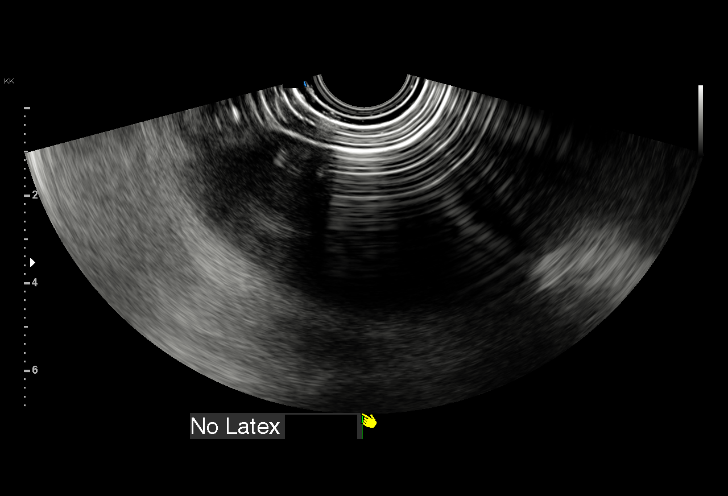
[im 5/61]
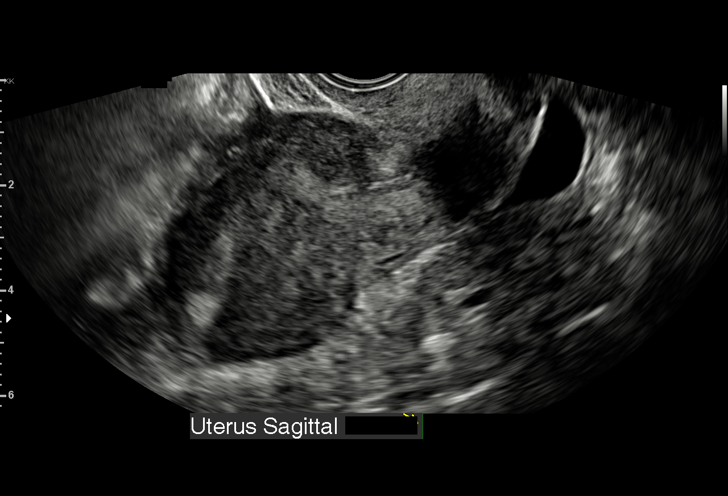
[im 9/61]
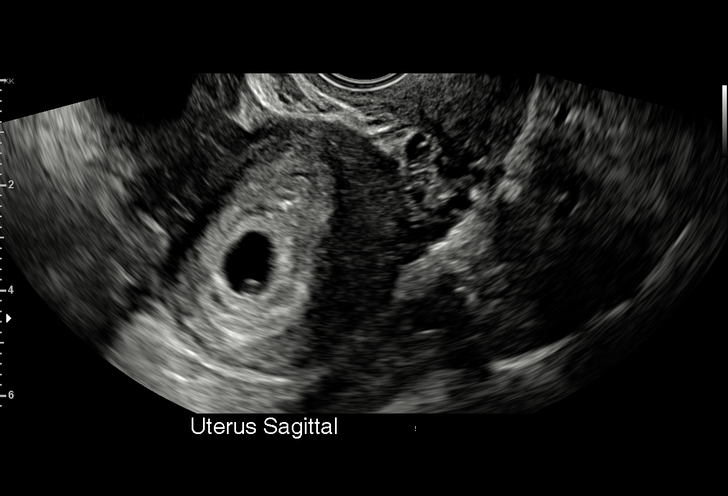
[im 14/61]
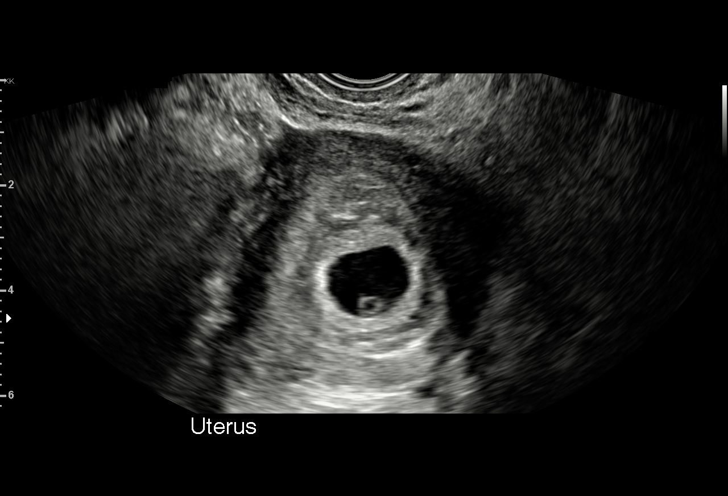
[im 18/61]
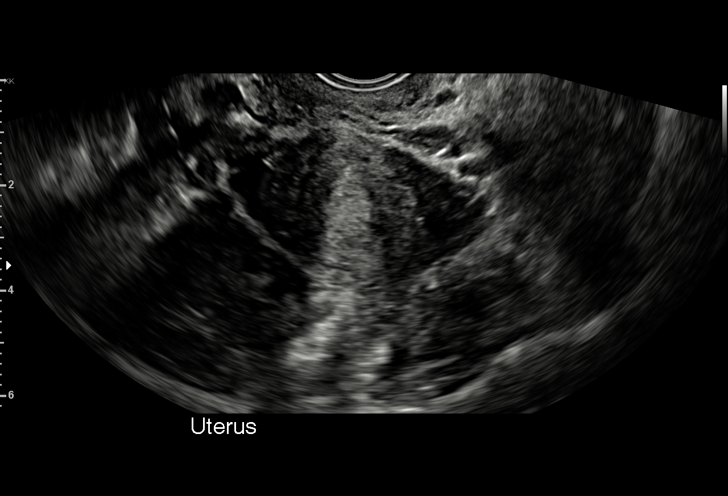
[im 23/61]
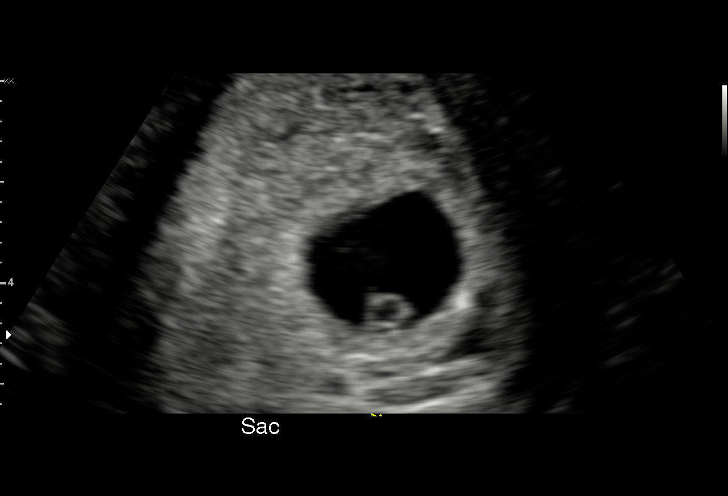
[im 27/61]
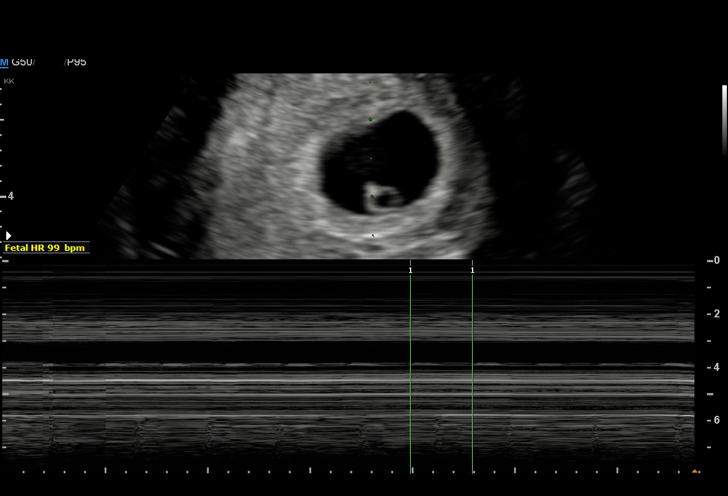
[im 32/61]
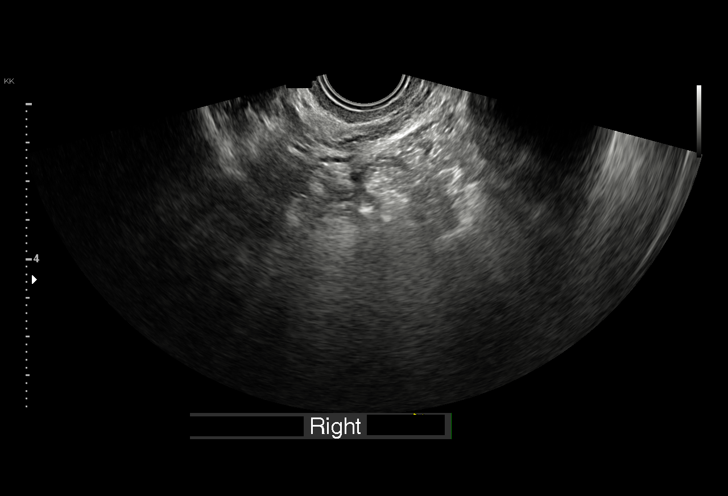
[im 34/61]
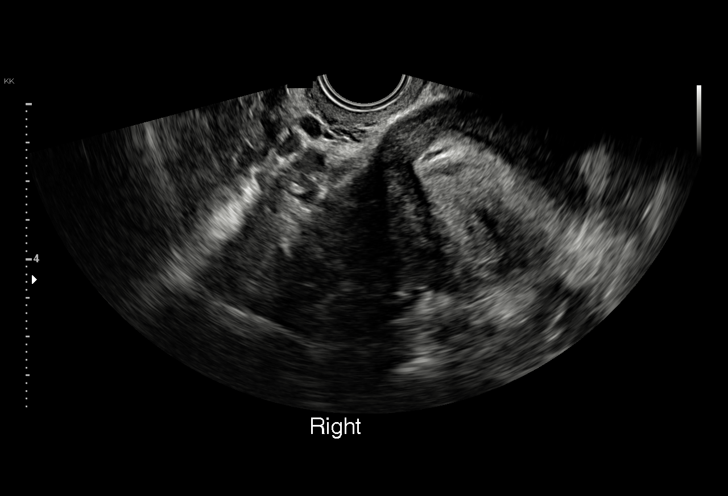
[im 38/61]
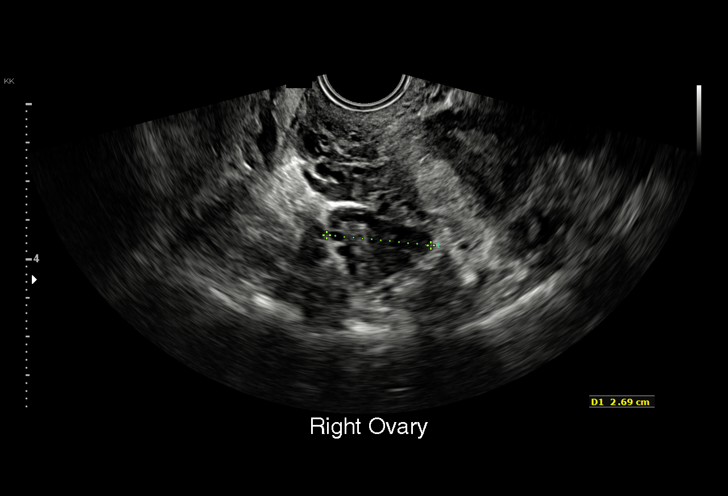
[im 43/61]
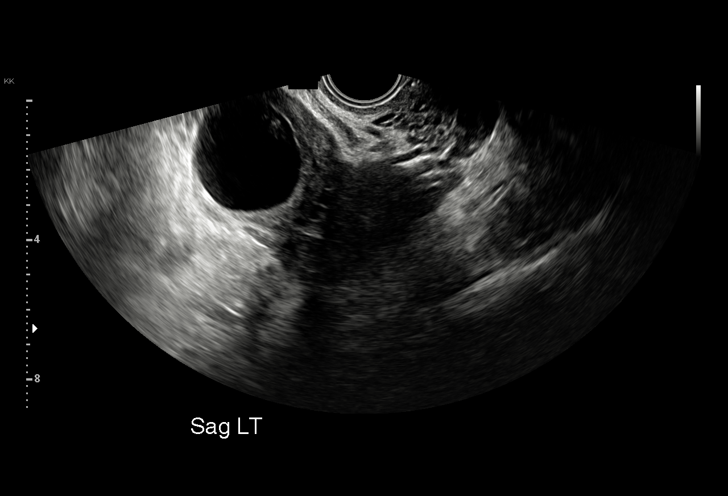
[im 47/61]
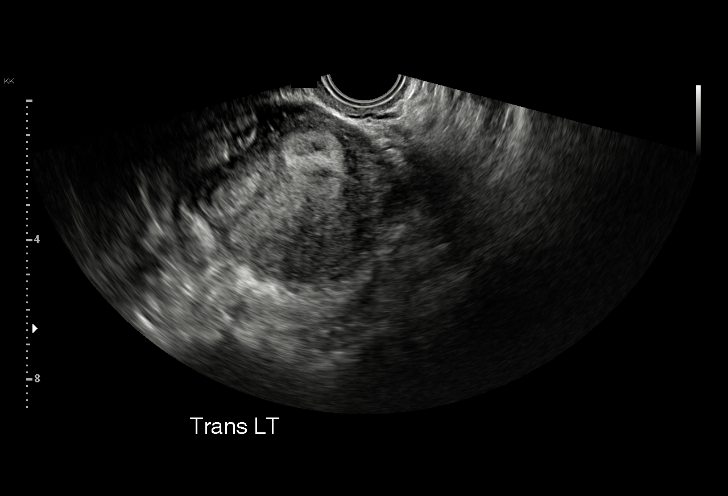
[im 52/61]
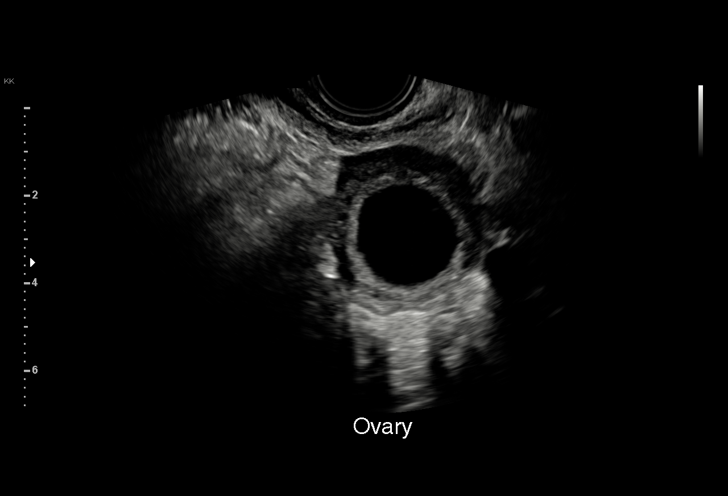
[im 56/61]
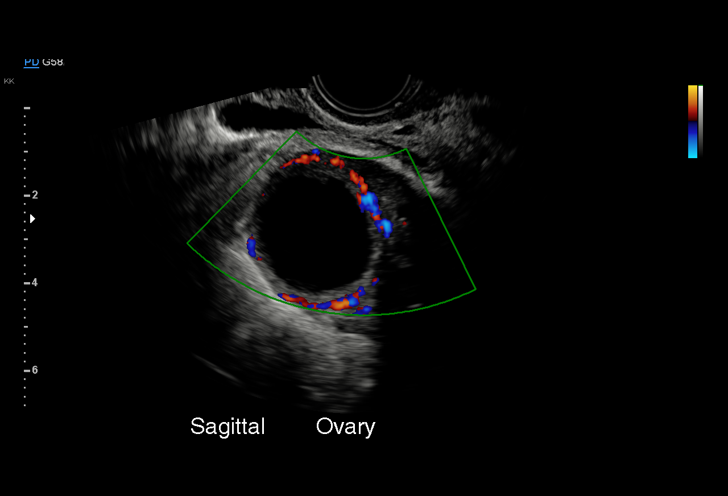
[im 61/61]
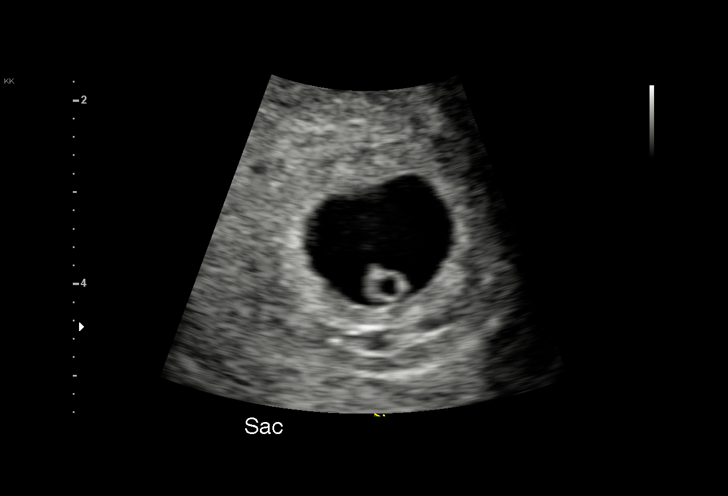

[15 of 28 positions shown; findings below may reference images not displayed]

FINDINGS: Intrauterine gestational sac: Single

Yolk sac:  Visualized

Embryo:  Visualized

Cardiac Activity: Visualized

Heart Rate: 99 bpm

MSD:   mm    w     d

CRL:   2.9 mm   5 w 5 d                  US EDC: 08/27/2018

Subchorionic hemorrhage:  None visualized.

Maternal uterus/adnexae: Left corpus luteal cyst. No adnexal mass.
Moderate free fluid.
IMPRESSION: Five week 5 day intrauterine pregnancy. Fetal bradycardia of 99
beats per minute which may be related to the very early gestational
age. This could be followed with repeat ultrasound in 14 days to
ensure expected progression.

Left corpus luteal cyst.

## 2019-08-25 IMAGING — US US OB TRANSVAGINAL
1 series · 15 of 28 positions shown · non-contrast
Comparison: 12/30/2017

CLINICAL DATA: First trimester pregnancy. Antepartum embryonic
bradycardia affecting care mother.

EXAM:
TRANSVAGINAL OB ULTRASOUND
TECHNIQUE: Transvaginal ultrasound was performed for complete evaluation of the
gestation as well as the maternal uterus, adnexal regions, and
pelvic cul-de-sac.

[Series 1: us ob transvaginal · 15 of 46 slices shown]
[im 1/46]
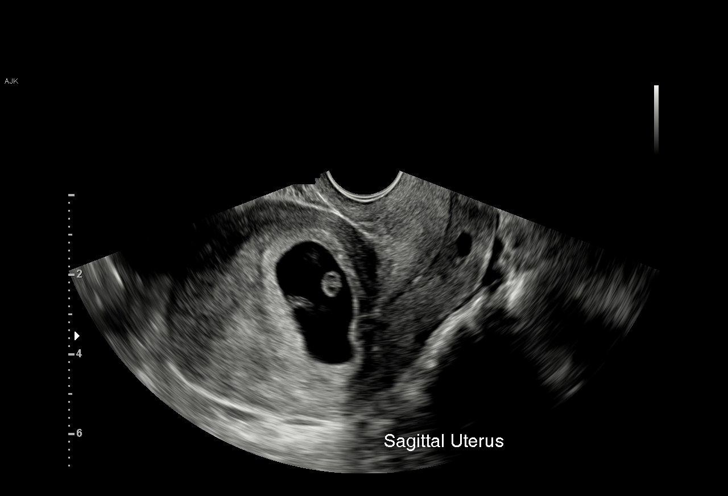
[im 4/46]
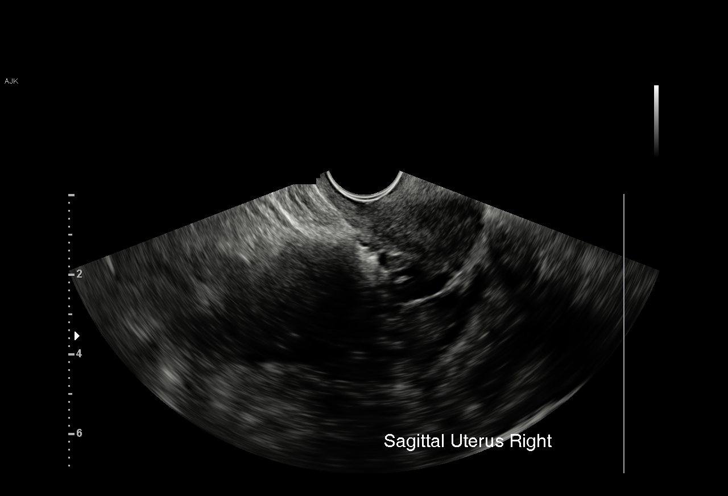
[im 7/46]
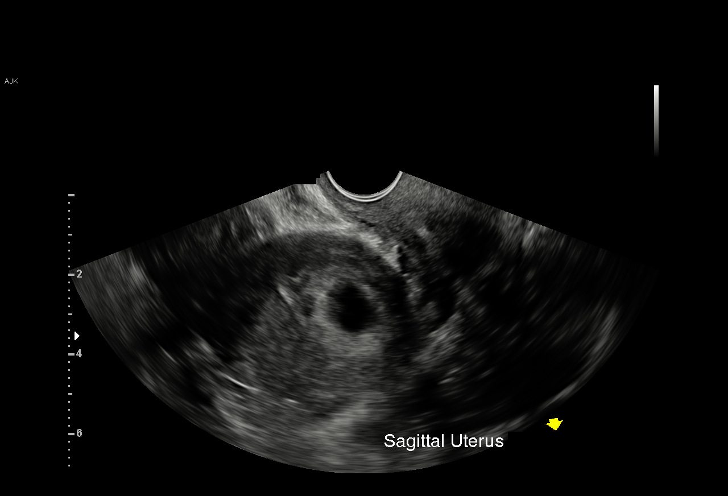
[im 11/46]
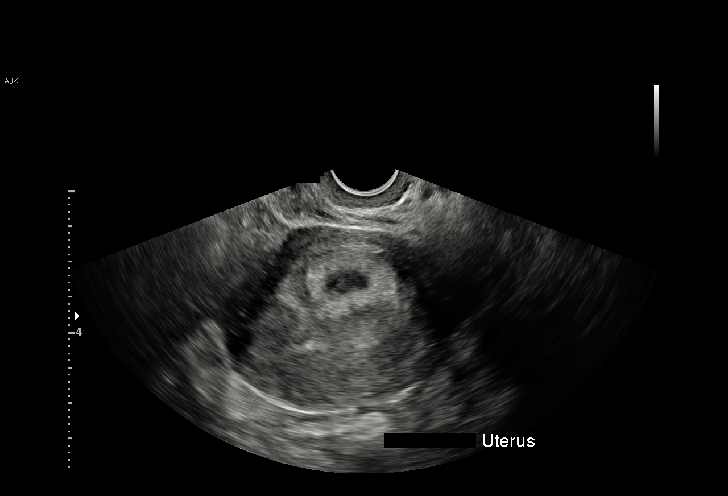
[im 14/46]
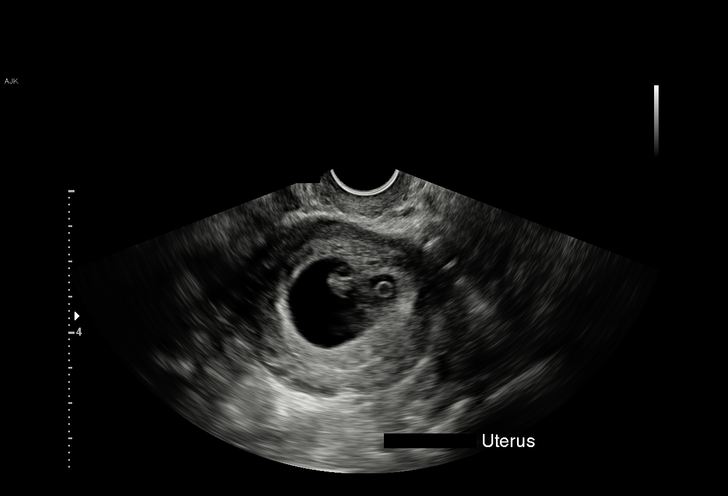
[im 17/46]
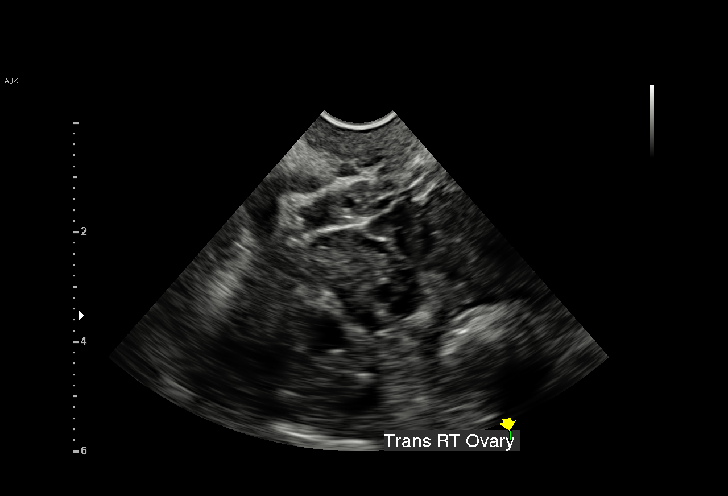
[im 21/46]
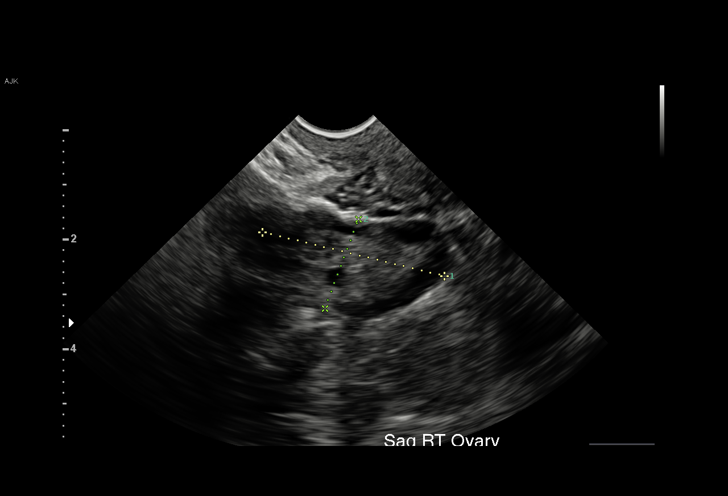
[im 24/46]
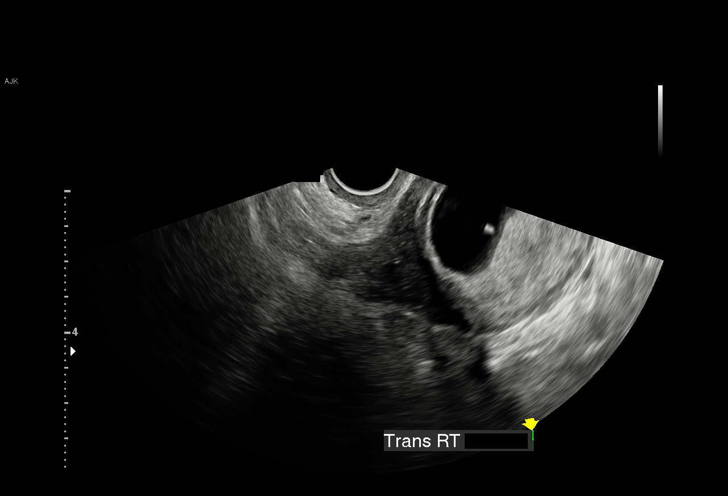
[im 26/46]
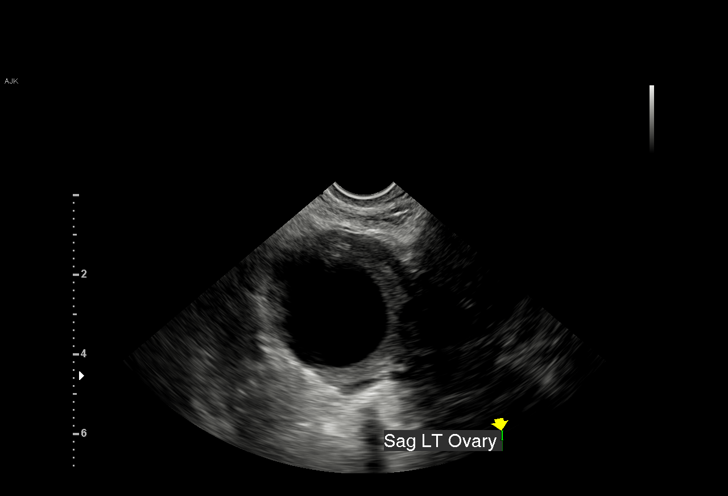
[im 29/46]
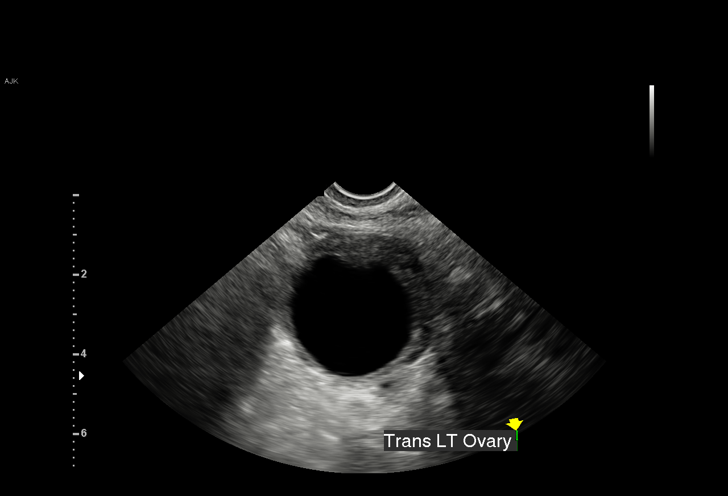
[im 32/46]
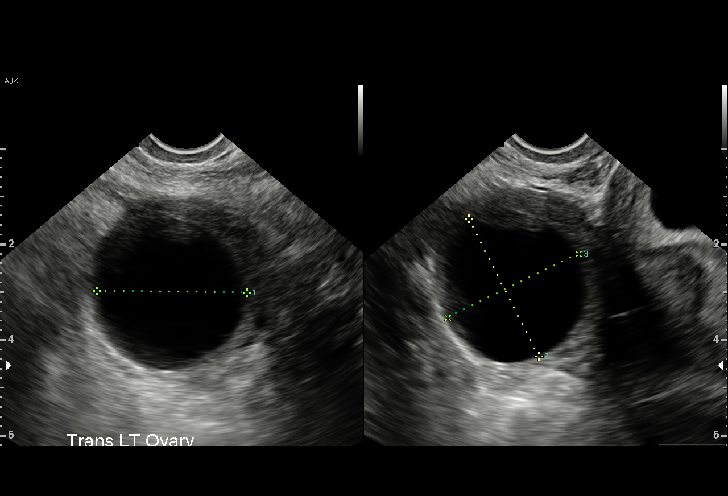
[im 36/46]
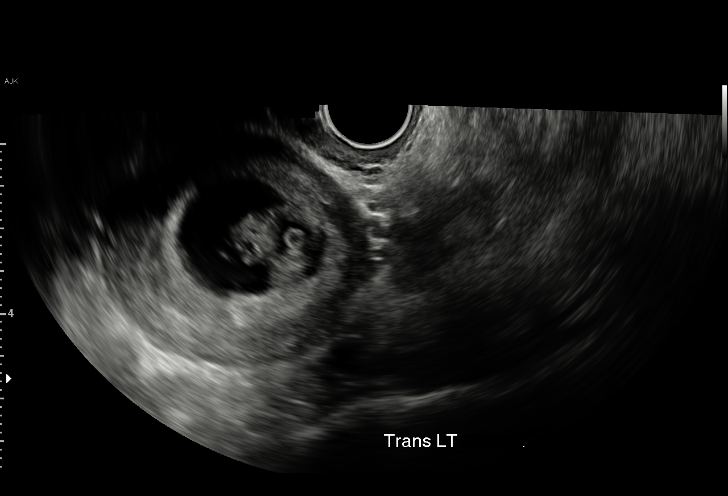
[im 39/46]
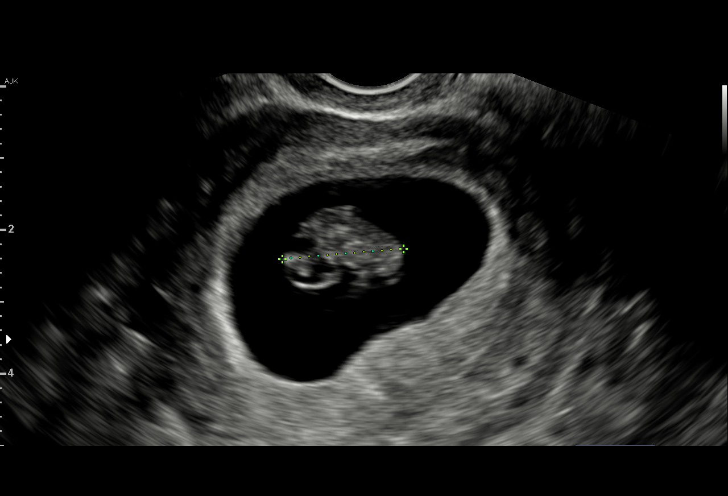
[im 42/46]
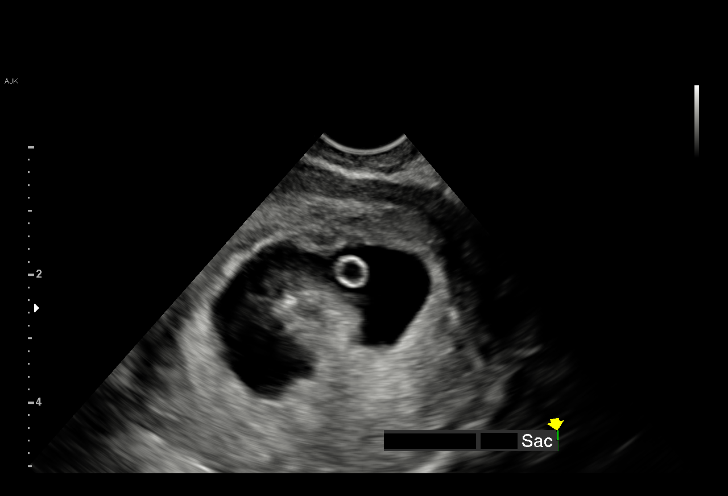
[im 46/46]
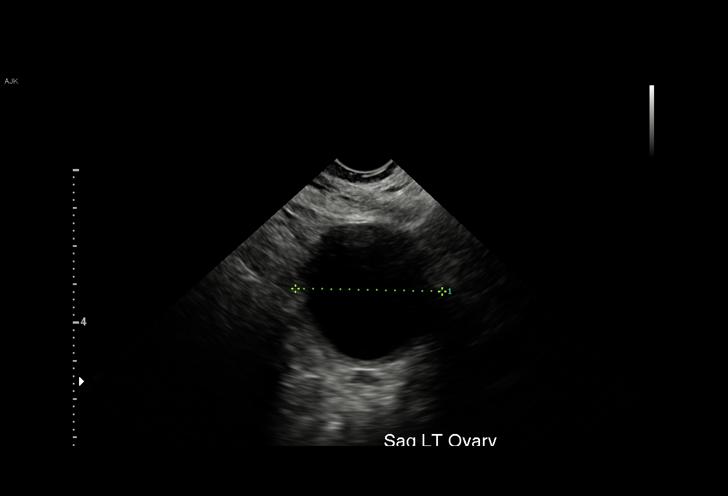

[15 of 28 positions shown; findings below may reference images not displayed]

FINDINGS: Intrauterine gestational sac: Single

Yolk sac:  Visualized.

Embryo:  Visualized.

Cardiac Activity: Visualized.

Heart Rate: 164 bpm

CRL:   17 mm   8 w 0 d                  US EDC: 08/25/2018

Subchorionic hemorrhage:  None visualized.

Maternal uterus/adnexae: 3.2 cm simple left ovarian corpus luteum
cyst. No mass or abnormal free fluid identified.
IMPRESSION: Single living IUP with assigned gestational age of 8 weeks 2 days by
prior ultrasound. Appropriate interval growth. Normal embryonic
heart rate.

No significant maternal uterine or adnexal abnormality identified.

## 2019-09-09 DIAGNOSIS — R519 Headache, unspecified: Secondary | ICD-10-CM | POA: Diagnosis not present

## 2019-09-14 ENCOUNTER — Inpatient Hospital Stay (EMERGENCY_DEPARTMENT_HOSPITAL)
Admission: AD | Admit: 2019-09-14 | Discharge: 2019-09-14 | Disposition: A | Discharge status: Home / Self Care | Payer: Medicaid Other | Source: Home / Self Care | Attending: Obstetrics & Gynecology | Admitting: Obstetrics & Gynecology

## 2019-09-14 ENCOUNTER — Other Ambulatory Visit: Payer: Self-pay

## 2019-09-14 ENCOUNTER — Emergency Department (HOSPITAL_COMMUNITY)
Admission: EM | Admit: 2019-09-14 | Discharge: 2019-09-14 | Disposition: A | Discharge status: Home / Self Care | Payer: Medicaid Other | Attending: Emergency Medicine | Admitting: Emergency Medicine

## 2019-09-14 ENCOUNTER — Encounter (HOSPITAL_COMMUNITY): Payer: Self-pay | Admitting: Emergency Medicine

## 2019-09-14 ENCOUNTER — Encounter (HOSPITAL_COMMUNITY): Payer: Self-pay | Admitting: Obstetrics & Gynecology

## 2019-09-14 ENCOUNTER — Emergency Department (HOSPITAL_COMMUNITY): Payer: Medicaid Other

## 2019-09-14 DIAGNOSIS — Z975 Presence of (intrauterine) contraceptive device: Secondary | ICD-10-CM

## 2019-09-14 DIAGNOSIS — N939 Abnormal uterine and vaginal bleeding, unspecified: Secondary | ICD-10-CM

## 2019-09-14 LAB — POCT PREGNANCY, URINE: Preg Test, Ur: NEGATIVE

## 2019-09-14 LAB — URINALYSIS, ROUTINE W REFLEX MICROSCOPIC
Bilirubin Urine: NEGATIVE
Glucose, UA: NEGATIVE mg/dL
Ketones, ur: 5 mg/dL — AB
Leukocytes,Ua: NEGATIVE
Nitrite: NEGATIVE
Protein, ur: 30 mg/dL — AB
RBC / HPF: >50 RBC/hpf — ABNORMAL HIGH (ref 0–5)
Specific Gravity, Urine: 1.028 (ref 1.005–1.030)
pH: 6 (ref 5.0–8.0)

## 2019-09-14 LAB — I-STAT BETA HCG BLOOD, ED (MC, WL, AP ONLY): I-stat hCG, quantitative: <5 m[IU]/mL (ref <5)

## 2019-09-14 LAB — WET PREP, GENITAL
Sperm: NONE SEEN
Trich, Wet Prep: NONE SEEN
Yeast Wet Prep HPF POC: NONE SEEN

## 2019-09-14 NOTE — MAU Provider Note (Signed)
First Provider Initiated Contact with Patient 09/14/19 1434      S Ms. Kalianne Fetting is a 24 y.o. [phone] non-pregnant female who presents to [facility]U today with complaint of LMP 08/28/2019 x 5 days (normal), spotting since the period ended, heavier vaginal bleeding that started today. She has an IUD that was placed after she delivered on 08/08/2018. She reports she has not had any abnormal vaginal bleeding.  O BP (!) 153/86   Pulse 89   Temp 98.2 F (36.8 C)   Resp 16   LMP 08/28/2019   SpO2 100%    Physical Exam  Nursing note and vitals reviewed. Constitutional: She is oriented to person, place, and time. She appears well-developed and well-nourished.  HENT:  Head: Normocephalic and atraumatic.  Eyes: Pupils are equal, round, and reactive to light.  Cardiovascular: Normal rate.  Respiratory: Effort normal.  GI: Soft.  Musculoskeletal:        General: Normal range of motion.     Cervical back: Normal range of motion.  Neurological: She is alert and oriented to person, place, and time.  Skin: Skin is warm and dry.  Psychiatric: She has a normal mood and affect. Her behavior is normal. Judgment and thought content normal.    A Non pregnant female Medical screening exam complete Abnormal Uterine Bleeding  P Advised that AUB is not unusual for IUD Discharge from MAU in stable condition Patient given the option of transfer to [facility] for further evaluation or seek care in outpatient facility of choice Advised to follow-up with [facility] for GYN appt  Warning signs for worsening condition that would warrant emergency follow-up discussed Patient may return to MAU as needed for pregnancy related complaints Patient verbalized an understanding of the plan of care and agrees.   Raelyn Mora, CNM 09/14/2019 2:34 PM

## 2019-09-14 NOTE — ED Provider Notes (Signed)
[facility] Provider Note   CSN: [id number] Arrival date & time: 09/14/19  1459     History No chief complaint on file.   Carla Hammond is a 24 y.o. female who presents to the ED today complaining of vaginal spotting x 5 days, worsening today.  She reports her last normal menstrual period was 2/14 and lasted approximately 5 days.  She states that she has a Mirena IUD in place that was placed approximately 1 year ago after she gave birth to her child.  She states that since then she has had a regular period however this is the first time she has had spotting in between her periods.  She reports she did not think much of it however when she was at work today she noticed a large amount of blood which concerned her.  Patient went to the [facility]U and had a pregnancy test which was negative and was sent here for further evaluation.  States last time she had intercourse was January 1 of this year.  She denies any vaginal discharge or pelvic pain.  Patient states she is concerned that she could be pregnant despite negative urine pregnancy test today. Denies fevers, chills, nausea, vomiting, urinary symptoms, or any other additional symptoms.   The history is provided by the patient and medical records.       Past Medical History:  Diagnosis Date  . Asthma   . Complication of anesthesia   . Hypertension, postpartum condition or complication 7/35/3299    Patient Active Problem List   Diagnosis Date Noted  . Pelvic pain 05/18/2019  . IUD (intrauterine device) in place 05/18/2019  . IUD check up 10/04/2018  . MRSA (methicillin resistant staph aureus) culture positive 08/08/2018  . 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 INDUCED ABORTION       OB History    Gravida  3   Para  1   Term  1   Preterm      AB  2   Living  1     SAB      TAB  2   Ectopic      Multiple      Live Births  1            Family History  Problem Relation Age of Onset  . Hypertension Mother   . Stroke Maternal Grandfather   . Heart disease Maternal Grandfather     Social History   Tobacco Use  . Smoking status: Never Smoker  . Smokeless tobacco: Never Used  Substance Use Topics  . Alcohol use: Yes    Comment: occasionally  . Drug use: Never    Home Medications Prior to Admission medications   Medication Sig Start Date End Date Taking? Authorizing Provider  albuterol (PROVENTIL HFA;VENTOLIN HFA) 108 (90 Base) MCG/ACT inhaler Inhale 2 puffs into the lungs every 6 (six) hours as needed for wheezing or shortness of breath.    [provider]  levonorgestrel (MIRENA) 20 MCG/24HR IUD 1 each by Intrauterine route once.    [provider]  naproxen (NAPROSYN) 500 MG tablet Take one tablet, with meals, twice daily as needed. 05/17/19   Gavin Pound, CNM  carvedilol (COREG) 12.5 MG tablet Take 1 tablet (12.5 mg total) by mouth 2 (two) times daily with a meal. Patient not taking: Reported on 02/11/2019 08/11/18 05/15/19  Shelly Bombard, MD  triamterene-hydrochlorothiazide [facility]) 37.5-25  MG tablet Take 1 tablet by mouth daily. Patient not taking: Reported on 02/11/2019 08/10/18 05/15/19  Arabella Merles, CNM    Allergies    Patient has no known allergies.  Review of Systems   Review of Systems  Constitutional: Negative for chills and fever.  Gastrointestinal: Negative for abdominal pain, nausea and vomiting.  Genitourinary: Positive for vaginal bleeding. Negative for dysuria, frequency and pelvic pain.  All other systems reviewed and are negative.   Physical Exam Updated Vital Signs BP 139/78 (BP Location: Right Arm)   Pulse 78   Temp 98.4 F (36.9 C) (Oral)   Resp (!) 98   LMP 08/28/2019   SpO2 100%   Physical Exam Vitals and nursing note reviewed.  Constitutional:      Appearance: She is not ill-appearing or diaphoretic.  HENT:     Head: Normocephalic  and atraumatic.  Eyes:     Conjunctiva/sclera: Conjunctivae normal.  Cardiovascular:     Rate and Rhythm: Normal rate and regular rhythm.     Pulses: Normal pulses.  Pulmonary:     Effort: Pulmonary effort is normal.     Breath sounds: Normal breath sounds. No wheezing, rhonchi or rales.  Abdominal:     Tenderness: There is no abdominal tenderness. There is no right CVA tenderness, left CVA tenderness, guarding or rebound.  Genitourinary:    Comments: Chaperone present for exam Gunnar Bulla, RN No rashes, lesions, or tenderness to external genitalia. No erythema, injury, or tenderness to vaginal mucosa. Small amount of blood noted in vault. No adnexal masses, tenderness, or fullness. No CMT, cervical friability, or discharge from cervical os. Cervical os is closed. Unable to visualize the IUD strings. Uterus non-deviated, mobile, nonTTP, and without enlargement.  Skin:    General: Skin is warm and dry.     Coloration: Skin is not jaundiced.  Neurological:     Mental Status: She is alert.     ED Results / Procedures / Treatments   Labs (all labs ordered are listed, but only abnormal results are displayed) Labs Reviewed  WET PREP, GENITAL - Abnormal; Notable for the following components:      Result Value   Clue Cells Wet Prep HPF POC PRESENT (*)    WBC, Wet Prep HPF POC MANY (*)    All other components within normal limits  URINALYSIS, ROUTINE W REFLEX MICROSCOPIC - Abnormal; Notable for the following components:   Hgb urine dipstick LARGE (*)    Ketones, ur 5 (*)    Protein, ur 30 (*)    RBC / HPF >50 (*)    Bacteria, UA RARE (*)    All other components within normal limits  I-STAT BETA HCG BLOOD, ED ([facility], [facility], [facility] ONLY)  GC/CHLAMYDIA PROBE AMP ([place]) NOT AT [facility]    EKG None  Radiology No results found.  Procedures Procedures (including critical care time)  Medications Ordered in ED Medications - No data to display  ED Course  I have reviewed the triage  vital signs and the nursing notes.  Pertinent labs & imaging results that were available during my care of the patient were reviewed by me and considered in my medical decision making (see chart for details).  Clinical Course as of Sep 14 1855  Wed Sep 14, 2019  1741 I-stat hCG, quantitative: <5.0 [MV]  1837 Clue Cells Wet Prep HPF POC(!): PRESENT [MV]    Clinical Course User Index [MV] Tanda Rockers, PA-C   24 year old female presents  to the ED with complaint of vaginal spotting in between her menses for the last 5 days.  Last normal menstrual period 2/14.  Negative pregnancy test at [facility] and sent here for further evaluation.  Denies any other symptoms at this time.  No vaginal discharge or specific pelvic pain.  She does endorse some intermittent abdominal cramping however no nausea or vomiting.  On arrival to the ED patient afebrile, nontachycardic and nontachypneic.  She appears to be in no acute distress. beta hCG obtained while patient in the waiting room, negative.  Will perform pelvic exam today and reevaluate.  Reports she has never had abnormal uterine bleeding until now however this may be the beginning of intermittent bleeding in between her periods.  On abdominal exam she has no specific tenderness.  No peritoneal signs.  Very much doubt acute abdomen and do not feel patient needs lab work at this time for further evaluation.   On pelvic exam patient does have some blood to the vault however no other acute findings.  I am unable to visualize the IUD strings.  Given this and vaginal bleeding will obtain pelvic ultrasound to ensure location of the IUD.   Wet prep positive for clue cells however patient without any complaints of vaginal discharge or foul odor. Low suspicion for this being the cause of pt's bleeding. Do not feel she needs treatment today given asymptomatic.   6:56 PM At shift change case signed out to [facility], [place], who will follow up on ultrasound results to confirm  IUD placement. Pt has a follow up appointment with her OBGYN scheduled for 03/09. Advised to keep appointment.  MDM Rules/Calculators/A&P                       Final Clinical Impression(s) / ED Diagnoses Final diagnoses:  Abnormal uterine bleeding    Rx / DC Orders ED Discharge Orders    None       Tanda Rockers, PA-C 09/14/19 1857    Geoffery Lyons, MD 09/14/19 [phone]

## 2019-09-14 NOTE — MAU Note (Addendum)
.   Carla Hammond is a 24 y.o.  here in MAU reporting: irregular vaginal bleeding after her LMP 08/28/19. Pt states that she has been spotting since her cycle stopped but started having heavier vaginal bleeding today. Pt has an IUD. Denies any pain PT DID NOT TAKE A PREGNANCY TEST LMP: 08/28/19 Onset of complaint: ongoing Pain score:  Vitals:   09/14/19 1420  BP: (!) 153/86  Pulse: 89  Resp: 16  Temp: 98.2 F (36.8 C)  SpO2: 100%     FHT: Lab orders placed from triage: UPT

## 2019-09-14 NOTE — ED Provider Notes (Signed)
Carla Hammond is a 24 y.o. female, presenting to the ED with vaginal bleeding.  HPI from [place], PA-C: "Cynthia Cogle is a 24 y.o. female who presents to the ED today complaining of vaginal spotting x 5 days, worsening today.  She reports her last normal menstrual period was 2/14 and lasted approximately 5 days.  She states that she has a Mirena IUD in place that was placed approximately 1 year ago after she gave birth to her child.  She states that since then she has had a regular period however this is the first time she has had spotting in between her periods.  She reports she did not think much of it however when she was at work today she noticed a large amount of blood which concerned her.  Patient went to the [facility]U and had a pregnancy test which was negative and was sent here for further evaluation.  States last time she had intercourse was January 1 of this year.  She denies any vaginal discharge or pelvic pain.  Patient states she is concerned that she could be pregnant despite negative urine pregnancy test today. Denies fevers, chills, nausea, vomiting, urinary symptoms, or any other additional symptoms."  Past Medical History:  Diagnosis Date  . Asthma   . Complication of anesthesia   . Hypertension, postpartum condition or complication 11/16/3974     Physical Exam  BP 139/78 (BP Location: Right Arm)   Pulse 78   Temp 98.4 F (36.9 C) (Oral)   Resp 18   LMP 08/28/2019   SpO2 100%   Physical Exam Vitals and nursing note reviewed.  Constitutional:      General: She is not in acute distress.    Appearance: She is well-developed. She is not diaphoretic.  HENT:     Head: Normocephalic and atraumatic.  Eyes:     Conjunctiva/sclera: Conjunctivae normal.  Cardiovascular:     Rate and Rhythm: Normal rate and regular rhythm.  Pulmonary:     Effort: Pulmonary effort is normal.  Abdominal:     Palpations: Abdomen is soft.     Tenderness: There is no abdominal tenderness.  There is no guarding.  Musculoskeletal:     Cervical back: Neck supple.  Skin:    General: Skin is warm and dry.     Coloration: Skin is not pale.  Neurological:     Mental Status: She is alert.  Psychiatric:        Behavior: Behavior normal.     ED Course/Procedures   Clinical Course as of Sep 13 2016  Wed Sep 14, 2019  1741 I-stat hCG, quantitative: <5.0 [MV]  1837 Clue Cells Wet Prep HPF POC(!): PRESENT [MV]    Clinical Course User Index [MV] Eustaquio Maize, PA-C    Procedures   Abnormal Labs Reviewed  WET PREP, GENITAL - Abnormal; Notable for the following components:      Result Value   Clue Cells Wet Prep HPF POC PRESENT (*)    WBC, Wet Prep HPF POC MANY (*)    All other components within normal limits  URINALYSIS, ROUTINE W REFLEX MICROSCOPIC - Abnormal; Notable for the following components:   Hgb urine dipstick LARGE (*)    Ketones, ur 5 (*)    Protein, ur 30 (*)    RBC / HPF >50 (*)    Bacteria, UA RARE (*)    All other components within normal limits    US Transvaginal Non-OB  Result Date: 09/14/2019 CLINICAL DATA:  Vaginal bleeding and pain with IUD, evaluate placement EXAM: TRANSABDOMINAL AND TRANSVAGINAL ULTRASOUND OF PELVIS DOPPLER ULTRASOUND OF OVARIES TECHNIQUE: Both transabdominal and transvaginal ultrasound examinations of the pelvis were performed. Transabdominal technique was performed for global imaging of the pelvis including uterus, ovaries, adnexal regions, and pelvic cul-de-sac. It was necessary to proceed with endovaginal exam following the transabdominal exam to visualize the ovaries. Color and duplex Doppler ultrasound was utilized to evaluate blood flow to the ovaries. COMPARISON:  03/07/2019 FINDINGS: Uterus Measurements: 6.9 x 3.6 x 4.5 cm. = volume: 59 mL. No fibroids or other mass visualized. Endometrium Thickness: 2 mm. IUD is noted in place but appears in the lower uterine segment and extending partially into the proximal aspect of the  cervix. This is lower than that visualized on the prior examination. Right ovary Measurements: 2.9 x 1.5 x 1.9 cm. = volume: 4.2 mL. Scattered small follicles are noted. Left ovary Measurements: 3.1 x 2.1 x 2.1 cm = volume: 6.9 mL. Normal appearance/no adnexal mass. Pulsed Doppler evaluation of both ovaries demonstrates normal low-resistance arterial and venous waveforms. Other findings Small amount of free fluid is noted likely physiologic in nature. IMPRESSION: IUD is noted in place although lower than that expected which may correspond with the patient's clinical symptomatology. It is definitively lower than that noted on the prior exam. No other focal abnormality is seen. Electronically Signed   By: Alcide Clever M.D.   On: 09/14/2019 19:35   US Pelvis Complete  Result Date: 09/14/2019 CLINICAL DATA:  Vaginal bleeding and pain with IUD, evaluate placement EXAM: TRANSABDOMINAL AND TRANSVAGINAL ULTRASOUND OF PELVIS DOPPLER ULTRASOUND OF OVARIES TECHNIQUE: Both transabdominal and transvaginal ultrasound examinations of the pelvis were performed. Transabdominal technique was performed for global imaging of the pelvis including uterus, ovaries, adnexal regions, and pelvic cul-de-sac. It was necessary to proceed with endovaginal exam following the transabdominal exam to visualize the ovaries. Color and duplex Doppler ultrasound was utilized to evaluate blood flow to the ovaries. COMPARISON:  03/07/2019 FINDINGS: Uterus Measurements: 6.9 x 3.6 x 4.5 cm. = volume: 59 mL. No fibroids or other mass visualized. Endometrium Thickness: 2 mm. IUD is noted in place but appears in the lower uterine segment and extending partially into the proximal aspect of the cervix. This is lower than that visualized on the prior examination. Right ovary Measurements: 2.9 x 1.5 x 1.9 cm. = volume: 4.2 mL. Scattered small follicles are noted. Left ovary Measurements: 3.1 x 2.1 x 2.1 cm = volume: 6.9 mL. Normal appearance/no adnexal mass.  Pulsed Doppler evaluation of both ovaries demonstrates normal low-resistance arterial and venous waveforms. Other findings Small amount of free fluid is noted likely physiologic in nature. IMPRESSION: IUD is noted in place although lower than that expected which may correspond with the patient's clinical symptomatology. It is definitively lower than that noted on the prior exam. No other focal abnormality is seen. Electronically Signed   By: Alcide Clever M.D.   On: 09/14/2019 19:35   [place] Art/Ven Flow Abd Pelv Doppler  Result Date: 09/14/2019 CLINICAL DATA:  Vaginal bleeding and pain with IUD, evaluate placement EXAM: TRANSABDOMINAL AND TRANSVAGINAL ULTRASOUND OF PELVIS DOPPLER ULTRASOUND OF OVARIES TECHNIQUE: Both transabdominal and transvaginal ultrasound examinations of the pelvis were performed. Transabdominal technique was performed for global imaging of the pelvis including uterus, ovaries, adnexal regions, and pelvic cul-de-sac. It was necessary to proceed with endovaginal exam following the transabdominal exam to visualize the ovaries. Color and duplex Doppler ultrasound was utilized to evaluate blood  flow to the ovaries. COMPARISON:  03/07/2019 FINDINGS: Uterus Measurements: 6.9 x 3.6 x 4.5 cm. = volume: 59 mL. No fibroids or other mass visualized. Endometrium Thickness: 2 mm. IUD is noted in place but appears in the lower uterine segment and extending partially into the proximal aspect of the cervix. This is lower than that visualized on the prior examination. Right ovary Measurements: 2.9 x 1.5 x 1.9 cm. = volume: 4.2 mL. Scattered small follicles are noted. Left ovary Measurements: 3.1 x 2.1 x 2.1 cm = volume: 6.9 mL. Normal appearance/no adnexal mass. Pulsed Doppler evaluation of both ovaries demonstrates normal low-resistance arterial and venous waveforms. Other findings Small amount of free fluid is noted likely physiologic in nature. IMPRESSION: IUD is noted in place although lower than that  expected which may correspond with the patient's clinical symptomatology. It is definitively lower than that noted on the prior exam. No other focal abnormality is seen. Electronically Signed   By: Alcide Clever M.D.   On: 09/14/2019 19:35    MDM    Patient care handoff report received from [facility], [place]. Plan: Follow-up on ultrasound.  Disposition patient accordingly.  Patient presented with a complaint of vaginal bleeding. Patient is nontoxic appearing, afebrile, not tachycardic, not tachypneic, not hypotensive, maintains excellent SPO2 on room air, and is in no apparent distress.   I reviewed and interpreted the patient's labs and radiological studies. Ultrasound shows IUD in place, though somewhat lower than expected.  This was discussed with the patient.  We also discussed using alternative birth control methods until her appointment with OB/GYN scheduled for March 9. Return precautions discussed.  Patient voices understanding of these instructions, accepts the plan, and is comfortable with discharge.    Previous provider already addressed finding of clue cells on wet prep.  Vitals:   09/14/19 1504 09/14/19 2026  BP: 139/78 (!) 144/87  Pulse: 78 70  Resp: 18 16  Temp: 98.4 F (36.9 C)   TempSrc: Oral   SpO2: 100% 100%      Concepcion Living 09/14/19 2209    Charlynne Pander, MD 09/17/19 1140

## 2019-09-14 NOTE — Discharge Instructions (Addendum)
Please keep the scheduled appointment with OB/GYN. Return to the emergency department for abdominal pain, fever, persistent vomiting, bleeding through more than a pad an hour, or any other major concerns.

## 2019-09-14 NOTE — ED Triage Notes (Signed)
Pt here with c/o spotting , had a negative urine pregnancy test , no abd pain , pt has had her mirena implant for about 1 year

## 2019-09-15 LAB — GC/CHLAMYDIA PROBE AMP (~~LOC~~) NOT AT ARMC
Chlamydia: NEGATIVE
Neisseria Gonorrhea: NEGATIVE

## 2019-09-19 ENCOUNTER — Other Ambulatory Visit: Payer: Self-pay

## 2019-09-19 ENCOUNTER — Encounter (HOSPITAL_COMMUNITY): Payer: Self-pay | Admitting: Emergency Medicine

## 2019-09-19 ENCOUNTER — Ambulatory Visit (HOSPITAL_COMMUNITY)
Admission: EM | Admit: 2019-09-19 | Discharge: 2019-09-19 | Disposition: A | Discharge status: Home / Self Care | Payer: Medicaid Other | Attending: Family Medicine | Admitting: Family Medicine

## 2019-09-19 DIAGNOSIS — T8384XD Pain from genitourinary prosthetic devices, implants and grafts, subsequent encounter: Secondary | ICD-10-CM | POA: Diagnosis not present

## 2019-09-19 MED ORDER — KETOROLAC TROMETHAMINE 60 MG/2ML IM SOLN
INTRAMUSCULAR | Status: AC
  Filled 2019-09-19: qty 2

## 2019-09-19 MED ORDER — KETOROLAC TROMETHAMINE 60 MG/2ML IM SOLN
60.0000 mg | Freq: Once | INTRAMUSCULAR | Status: AC
  Administered 2019-09-19: 60 mg via INTRAMUSCULAR

## 2019-09-19 NOTE — ED Provider Notes (Signed)
[facility]    CSN: [id number] Arrival date & time: 09/19/19  2353      History   Chief Complaint No chief complaint on file.   HPI Carla Hammond is a 24 y.o. female.   HPI  Patient has multiple medical visits for pain from her IUD.  She felt she was unable to work today because of IUD pain.  She does have an appointment tomorrow with a GYN to discuss alternate birth control.  She needs a note for work today.   the IUD is particularly painful today with stabbing pain that has her bent over at times.  No fever chills.  No vaginal discharge.  No irregular vaginal bleeding on today's date.  Past Medical History:  Diagnosis Date  . Asthma   . Complication of anesthesia   . Hypertension, postpartum condition or complication 09/07/2018    Patient Active Problem List   Diagnosis Date Noted  . Pelvic pain 05/18/2019  . IUD (intrauterine device) in place 05/18/2019  . IUD check up 10/04/2018  . MRSA (methicillin resistant staph aureus) culture positive 08/08/2018  . 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 INDUCED ABORTION      OB History    Gravida  3   Para  1   Term  1   Preterm      AB  2   Living  1     SAB      TAB  2   Ectopic      Multiple      Live Births  1            Home Medications    Prior to Admission medications   Medication Sig Start Date End Date Taking? Authorizing Provider  albuterol (PROVENTIL HFA;VENTOLIN HFA) 108 (90 Base) MCG/ACT inhaler Inhale 2 puffs into the lungs every 6 (six) hours as needed for wheezing or shortness of breath.    [provider]  levonorgestrel (MIRENA) 20 MCG/24HR IUD 1 each by Intrauterine route once.    [provider]  naproxen (NAPROSYN) 500 MG tablet Take one tablet, with meals, twice daily as needed. Patient not taking: Reported on 09/14/2019 05/17/19   Gerrit Heck, CNM  carvedilol (COREG) 12.5 MG tablet Take 1 tablet  (12.5 mg total) by mouth 2 (two) times daily with a meal. Patient not taking: Reported on 02/11/2019 08/11/18 05/15/19  Brock Bad, MD  triamterene-hydrochlorothiazide (MAXZIDE-25) 37.5-25 MG tablet Take 1 tablet by mouth daily. Patient not taking: Reported on 02/11/2019 08/10/18 05/15/19  Arabella Merles, CNM    Family History Family History  Problem Relation Age of Onset  . Hypertension Mother   . Stroke Maternal Grandfather   . Heart disease Maternal Grandfather     Social History Social History   Tobacco Use  . Smoking status: Never Smoker  . Smokeless tobacco: Never Used  Substance Use Topics  . Alcohol use: Yes    Comment: occasionally  . Drug use: Never     Allergies   Patient has no known allergies.   Review of Systems Review of Systems  Constitutional: Negative for chills and fever.  Gastrointestinal: Negative for nausea.  Genitourinary: Positive for pelvic pain. Negative for menstrual problem and vaginal discharge.     Physical Exam Triage Vital Signs ED Triage Vitals  Enc Vitals Group     BP 09/19/19 1005 133/88     Pulse Rate 09/19/19 1005 (!)  58     Resp 09/19/19 1005 16     Temp 09/19/19 1005 99.2 F (37.3 C)     Temp Source 09/19/19 1005 Oral     SpO2 09/19/19 1005 98 %     Weight --      Height --      Head Circumference --      Peak Flow --      Pain Score 09/19/19 1003 7     Pain Loc --      Pain Edu? --      Excl. in [place]? --    No data found.  Updated Vital Signs BP 133/88   Pulse (!) 58   Temp 99.2 F (37.3 C) (Oral)   Resp 16   LMP 08/28/2019   SpO2 98%     Physical Exam Constitutional:      General: She is not in acute distress.    Appearance: She is well-developed. She is obese.     Comments: Patient is in no acute distress  HENT:     Head: Normocephalic and atraumatic.  Eyes:     Conjunctiva/sclera: Conjunctivae normal.     Pupils: Pupils are equal, round, and reactive to light.  Cardiovascular:     Rate and  Rhythm: Normal rate.  Pulmonary:     Effort: Pulmonary effort is normal. No respiratory distress.  Abdominal:     General: There is no distension.     Palpations: Abdomen is soft.     Tenderness: There is no abdominal tenderness.     Comments: Abdomen soft.  Not distended  Musculoskeletal:        General: Normal range of motion.     Cervical back: Normal range of motion.  Skin:    General: Skin is warm and dry.  Neurological:     Mental Status: She is alert.  Psychiatric:        Mood and Affect: Mood normal.        Behavior: Behavior normal.      UC Treatments / Results  Labs (all labs ordered are listed, but only abnormal results are displayed) Labs Reviewed - No data to display  EKG   Radiology No results found.  Procedures Procedures (including critical care time)  Medications Ordered in UC Medications  ketorolac (TORADOL) injection 60 mg (has no administration in time range)    Initial Impression / Assessment and Plan / UC Course  I have reviewed the triage vital signs and the nursing notes.  Pertinent labs & imaging results that were available during my care of the patient were reviewed by me and considered in my medical decision making (see chart for details).     We will give her a note for work.  We will give her out of a shot of Toradol for her pain today.  Follow-up with GYN tomorrow Final Clinical Impressions(s) / UC Diagnoses   Final diagnoses:  Pain due to intrauterine contraceptive device (IUD), subsequent encounter     Discharge Instructions     See your GYN tomorrow   ED Prescriptions    None     PDMP not reviewed this encounter.   Raylene Everts, MD 09/19/19 [id number]

## 2019-09-19 NOTE — Discharge Instructions (Signed)
See your GYN tomorrow

## 2019-09-19 NOTE — ED Triage Notes (Signed)
Thursday she reports spotting and bleeding, she was evaluated in the ER that day. An US showed that it was out of place. She has an appointment with GYN tomorrow. ER provided a work note for the weekend. PT reports continued pain. No bleeding at this time. She took OTC meds last night without relief. She has taken tylenol and ibuprofen this morning without relief.

## 2019-09-20 ENCOUNTER — Encounter: Payer: Self-pay | Admitting: Family Medicine

## 2019-09-20 ENCOUNTER — Ambulatory Visit (INDEPENDENT_AMBULATORY_CARE_PROVIDER_SITE_OTHER): Payer: Medicaid Other | Admitting: Family Medicine

## 2019-09-20 ENCOUNTER — Encounter: Payer: Self-pay | Admitting: Obstetrics

## 2019-09-20 VITALS — BP 133/87 | HR 80 | Ht 63.0 in | Wt 231.9 lb

## 2019-09-20 DIAGNOSIS — Z30432 Encounter for removal of intrauterine contraceptive device: Secondary | ICD-10-CM | POA: Diagnosis not present

## 2019-09-20 DIAGNOSIS — Z3009 Encounter for other general counseling and advice on contraception: Secondary | ICD-10-CM | POA: Diagnosis not present

## 2019-09-20 DIAGNOSIS — Z3042 Encounter for surveillance of injectable contraceptive: Secondary | ICD-10-CM

## 2019-09-20 MED ORDER — MEDROXYPROGESTERONE ACETATE 150 MG/ML IM SUSP
150.0000 mg | Freq: Once | INTRAMUSCULAR | Status: AC
  Administered 2019-09-20: 150 mg via INTRAMUSCULAR

## 2019-09-20 MED ORDER — MEDROXYPROGESTERONE ACETATE 150 MG/ML IM SUSP: 150.0000 mg | INTRAMUSCULAR | 3 refills | Status: AC

## 2019-09-20 NOTE — Progress Notes (Signed)
   IUD Removal Procedure Note  Carla Hammond is a 24 year old [id number] who is here for Mirena IUD removal. She would like IUD removed secondary to pain and AUB. She has been seen in the ER twice in the last two weeks due to pain and heavy bleeding. Since having the Mirena placed she has had abnormal periods and more pain during her menstrual cycles. She understands that she could get pregnant after removal of IUD if she does not use another form of contraception. She has no other complaints today. Reviewed risks of removal including pain, bleeding, difficult removal and inability to remove IUD which may require hysteroscopic removal in OR. She affirms that she would like IUD removed.  Vitals:   09/20/19 0913  BP: 133/87  Pulse: 80    Patient with normal appearing external female genitalia. Graves speculum placed in vagina and IUD strings easily visualized. Strings grasped with curved Kelly forceps and removed easily. Minimal bleeding noted. All instruments removed from vagina. Patient tolerated procedure very well.   She was given post removal instructions. She is planning on using Depo for contraception- she was given this today.  1. Encounter for removal of intrauterine contraceptive device -removed per note above  2. Counseling for initiation of birth control method Patient has been counseled on birth control options today. We discussed risks and benefits of each available contraception, including but not limited to: IUD, Nexplanon, Depo Shot, Nuvaring, OCPs, and condoms/diaphragms. Counseled on risk for STDs not reduced by birth control use. The patient has been counseled on the proper usage, side effects and potential interactions of the new medication.   3. Encounter for surveillance of injectable contraceptive - medroxyPROGESTERone (DEPO-PROVERA) 150 MG/ML injection; Inject 1 mL (150 mg total) into the muscle every 3 (three) months.  Dispense: 1 mL; Refill: 3   Return in about 3 months  (around 12/21/2019) for Birth control follow up, 3 month Depo shot.  Marlowe Alt, DO OB Fellow, Faculty [facility] 09/20/2019 9:38 AM

## 2019-09-20 NOTE — Progress Notes (Addendum)
GYN presents for pelvic and abdominal pain 10/10 x 6++  IUD removed.  DEPO given in [facility], tolerated well.  Next DEPO May 25-December 20, 2019  Administrations This Visit    medroxyPROGESTERone (DEPO-PROVERA) injection 150 mg    Admin Date 09/20/2019 Action Given Dose 150 mg Route Intramuscular Administered By Maretta Bees, RMA

## 2019-09-20 NOTE — Addendum Note (Signed)
Addended by: Maretta Bees on: 09/20/2019 02:17 PM   Modules accepted: Orders

## 2019-09-20 NOTE — Patient Instructions (Signed)
Medroxyprogesterone injection [Contraceptive] What is this medicine? MEDROXYPROGESTERONE (me DROX ee proe JES te rone) contraceptive injections prevent pregnancy. They provide effective birth control for 3 months. Depo-subQ Provera 104 is also used for treating pain related to endometriosis. This medicine may be used for other purposes; ask your health care provider or pharmacist if you have questions. COMMON BRAND NAME(S): Depo-Provera, Depo-subQ Provera 104 What should I tell my health care provider before I take this medicine? They need to know if you have any of these conditions:  frequently drink alcohol  asthma  blood vessel disease or a history of a blood clot in the lungs or legs  bone disease such as osteoporosis  breast cancer  diabetes  eating disorder (anorexia nervosa or bulimia)  high blood pressure  HIV infection or AIDS  kidney disease  liver disease  mental depression  migraine  seizures (convulsions)  stroke  tobacco smoker  vaginal bleeding  an unusual or allergic reaction to medroxyprogesterone, other hormones, medicines, foods, dyes, or preservatives  pregnant or trying to get pregnant  breast-feeding How should I use this medicine? Depo-Provera Contraceptive injection is given into a muscle. Depo-subQ Provera 104 injection is given under the skin. These injections are given by a health care professional. You must not be pregnant before getting an injection. The injection is usually given during the first 5 days after the start of a menstrual period or 6 weeks after delivery of a baby. Talk to your pediatrician regarding the use of this medicine in children. Special care may be needed. These injections have been used in female children who have started having menstrual periods. Overdosage: If you think you have taken too much of this medicine contact a poison control center or emergency room at once. NOTE: This medicine is only for you. Do not  share this medicine with others. What if I miss a dose? Try not to miss a dose. You must get an injection once every 3 months to maintain birth control. If you cannot keep an appointment, call and reschedule it. If you wait longer than 13 weeks between Depo-Provera contraceptive injections or longer than 14 weeks between Depo-subQ Provera 104 injections, you could get pregnant. Use another method for birth control if you miss your appointment. You may also need a pregnancy test before receiving another injection. What may interact with this medicine? Do not take this medicine with any of the following medications:  bosentan This medicine may also interact with the following medications:  aminoglutethimide  antibiotics or medicines for infections, especially rifampin, rifabutin, rifapentine, and griseofulvin  aprepitant  barbiturate medicines such as phenobarbital or primidone  bexarotene  carbamazepine  medicines for seizures like ethotoin, felbamate, oxcarbazepine, phenytoin, topiramate  modafinil  St. John's wort This list may not describe all possible interactions. Give your health care provider a list of all the medicines, herbs, non-prescription drugs, or dietary supplements you use. Also tell them if you smoke, drink alcohol, or use illegal drugs. Some items may interact with your medicine. What should I watch for while using this medicine? This drug does not protect you against HIV infection (AIDS) or other sexually transmitted diseases. Use of this product may cause you to lose calcium from your bones. Loss of calcium may cause weak bones (osteoporosis). Only use this product for more than 2 years if other forms of birth control are not right for you. The longer you use this product for birth control the more likely you will be at risk   for weak bones. Ask your health care professional how you can keep strong bones. You may have a change in bleeding pattern or irregular periods.  Many females stop having periods while taking this drug. If you have received your injections on time, your chance of being pregnant is very low. If you think you may be pregnant, see your health care professional as soon as possible. Tell your health care professional if you want to get pregnant within the next year. The effect of this medicine may last a long time after you get your last injection. What side effects may I notice from receiving this medicine? Side effects that you should report to your doctor or health care professional as soon as possible:  allergic reactions like skin rash, itching or hives, swelling of the face, lips, or tongue  breast tenderness or discharge  breathing problems  changes in vision  depression  feeling faint or lightheaded, falls  fever  pain in the abdomen, chest, groin, or leg  problems with balance, talking, walking  unusually weak or tired  yellowing of the eyes or skin Side effects that usually do not require medical attention (report to your doctor or health care professional if they continue or are bothersome):  acne  fluid retention and swelling  headache  irregular periods, spotting, or absent periods  temporary pain, itching, or skin reaction at site where injected  weight gain This list may not describe all possible side effects. Call your doctor for medical advice about side effects. You may report side effects to FDA at 1-800-FDA-1088. Where should I keep my medicine? This does not apply. The injection will be given to you by a health care professional. NOTE: This sheet is a summary. It may not cover all possible information. If you have questions about this medicine, talk to your doctor, pharmacist, or health care provider.  2020 Elsevier/Gold Standard (2008-07-21 18:37:56)  

## 2019-10-27 DIAGNOSIS — U071 COVID-19: Secondary | ICD-10-CM | POA: Diagnosis not present

## 2019-12-14 ENCOUNTER — Ambulatory Visit: Payer: Medicaid Other

## 2019-12-20 ENCOUNTER — Other Ambulatory Visit: Payer: Self-pay

## 2019-12-20 ENCOUNTER — Ambulatory Visit (INDEPENDENT_AMBULATORY_CARE_PROVIDER_SITE_OTHER): Payer: Medicaid Other

## 2019-12-20 DIAGNOSIS — Z3042 Encounter for surveillance of injectable contraceptive: Secondary | ICD-10-CM

## 2019-12-20 MED ORDER — MEDROXYPROGESTERONE ACETATE 150 MG/ML IM SUSP
150.0000 mg | Freq: Once | INTRAMUSCULAR | Status: AC
  Administered 2019-12-20: 150 mg via INTRAMUSCULAR

## 2019-12-20 NOTE — Progress Notes (Signed)
Carla Hammond is here for a depo injection.  Pt tolerated injection well without complication. Next appointment should be between Aug. 25- Sept. 8. -EH/RMA

## 2019-12-20 NOTE — Progress Notes (Signed)
I have reviewed this chart and agree with the RN/CMA assessment and management.    K. Meryl Yaritzi Craun, M.D. Attending [facility] ([facility]ulty [facility])   

## 2019-12-27 DIAGNOSIS — J3489 Other specified disorders of nose and nasal sinuses: Secondary | ICD-10-CM | POA: Diagnosis not present

## 2019-12-27 DIAGNOSIS — J069 Acute upper respiratory infection, unspecified: Secondary | ICD-10-CM | POA: Diagnosis not present

## 2019-12-27 DIAGNOSIS — R05 Cough: Secondary | ICD-10-CM | POA: Diagnosis not present

## 2020-01-14 ENCOUNTER — Encounter (HOSPITAL_COMMUNITY): Payer: Self-pay

## 2020-01-14 ENCOUNTER — Other Ambulatory Visit: Payer: Self-pay

## 2020-01-14 ENCOUNTER — Ambulatory Visit (HOSPITAL_COMMUNITY)
Admission: EM | Admit: 2020-01-14 | Discharge: 2020-01-14 | Disposition: A | Discharge status: Home / Self Care | Payer: Medicaid Other | Attending: Urgent Care | Admitting: Urgent Care

## 2020-01-14 DIAGNOSIS — J3089 Other allergic rhinitis: Secondary | ICD-10-CM | POA: Insufficient documentation

## 2020-01-14 DIAGNOSIS — H9202 Otalgia, left ear: Secondary | ICD-10-CM | POA: Diagnosis not present

## 2020-01-14 DIAGNOSIS — Z20822 Contact with and (suspected) exposure to covid-19: Secondary | ICD-10-CM | POA: Diagnosis not present

## 2020-01-14 DIAGNOSIS — J029 Acute pharyngitis, unspecified: Secondary | ICD-10-CM | POA: Diagnosis not present

## 2020-01-14 LAB — POCT RAPID STREP A: Streptococcus, Group A Screen (Direct): NEGATIVE

## 2020-01-14 LAB — SARS CORONAVIRUS 2 (TAT 6-24 HRS): SARS Coronavirus 2: NEGATIVE

## 2020-01-14 MED ORDER — FLUTICASONE PROPIONATE 50 MCG/ACT NA SUSP: 2.0000 | Freq: Every day | NASAL | 12 refills | Status: AC

## 2020-01-14 MED ORDER — PSEUDOEPHEDRINE HCL 60 MG PO TABS: 60.0000 mg | ORAL_TABLET | Freq: Three times a day (TID) | ORAL | 0 refills | Status: AC | PRN

## 2020-01-14 MED ORDER — NAPROXEN 500 MG PO TABS: 500.0000 mg | ORAL_TABLET | Freq: Two times a day (BID) | ORAL | 0 refills | Status: AC

## 2020-01-14 NOTE — ED Provider Notes (Signed)
[facility]   MRN: [id number] DOB: [birth year]  Subjective:   Carla Hammond is a 24 y.o. female presenting for 1 week history of persistent throat pain, left ear pain.  Patient uses Zyrtec daily for allergies, has mild runny stuffy nose which is not new.  Symptoms are unchanged.  Has not taken anything for pain or inflammation.  Has not had her Covid vaccination.  No current facility-administered medications for this encounter.  Current Outpatient Medications:  .  albuterol (PROVENTIL HFA;VENTOLIN HFA) 108 (90 Base) MCG/ACT inhaler, Inhale 2 puffs into the lungs every 6 (six) hours as needed for wheezing or shortness of breath., Disp: , Rfl:  .  levonorgestrel (MIRENA) 20 MCG/24HR IUD, 1 each by Intrauterine route once., Disp: , Rfl:  .  medroxyPROGESTERone (DEPO-PROVERA) 150 MG/ML injection, Inject 1 mL (150 mg total) into the muscle every 3 (three) months., Disp: 1 mL, Rfl: 3 .  naproxen (NAPROSYN) 500 MG tablet, Take one tablet, with meals, twice daily as needed. (Patient not taking: Reported on 09/14/2019), Disp: 60 tablet, Rfl: 1   No Known Allergies  Past Medical History:  Diagnosis Date  . Asthma   . Complication of anesthesia   . Hypertension, postpartum condition or complication 09/07/2018     Past Surgical History:  Procedure Laterality Date  . INDUCED ABORTION      Family History  Problem Relation Age of Onset  . Hypertension Mother   . Stroke Maternal Grandfather   . Heart disease Maternal Grandfather     Social History   Tobacco Use  . Smoking status: Never Smoker  . Smokeless tobacco: Never Used  Vaping Use  . Vaping Use: Never used  Substance Use Topics  . Alcohol use: Yes    Comment: occasionally  . Drug use: Never    ROS Denies fever, ear drainage, sinus pain, cough, chest pain, shortness of breath, rashes.  Objective:   Vitals: BP 131/66 (BP Location: Right Arm)   Pulse 72   Temp 98.8 F (37.1 C) (Oral)   Resp 18   SpO2 97%    Physical Exam Constitutional:      General: She is not in acute distress.    Appearance: She is well-developed. She is not ill-appearing.  HENT:     Head: Normocephalic and atraumatic.     Right Ear: Ear canal normal. No drainage or tenderness. No middle ear effusion. Tympanic membrane is not erythematous.     Left Ear: Ear canal normal. No drainage or tenderness.  No middle ear effusion. Tympanic membrane is not erythematous.     Ears:     Comments: TMs opacified bilaterally but without erythema.  No tenderness on exam.  No tragus tenderness or tenderness over mastoid process.    Nose: Congestion present. No rhinorrhea.     Mouth/Throat:     Mouth: Mucous membranes are moist. No oral lesions.     Pharynx: No pharyngeal swelling, oropharyngeal exudate, posterior oropharyngeal erythema or uvula swelling.     Tonsils: No tonsillar exudate or tonsillar abscesses.     Comments: Significant postnasal drainage overlying pharynx. Eyes:     Extraocular Movements:     Right eye: Normal extraocular motion.     Left eye: Normal extraocular motion.     Conjunctiva/sclera: Conjunctivae normal.     Pupils: Pupils are equal, round, and reactive to light.  Cardiovascular:     Rate and Rhythm: Normal rate.  Pulmonary:     Effort: Pulmonary effort is normal.  Musculoskeletal:     Cervical back: Normal range of motion and neck supple.  Lymphadenopathy:     Cervical: No cervical adenopathy.  Skin:    General: Skin is warm and dry.  Neurological:     General: No focal deficit present.     Mental Status: She is alert and oriented to person, place, and time.  Psychiatric:        Mood and Affect: Mood normal.        Behavior: Behavior normal.     Results for orders placed or performed during the hospital encounter of 01/14/20 (from the past 24 hour(s))  POCT rapid strep A [facility])     Status: None   Collection Time: 01/14/20 12:16 PM  Result Value Ref Range   Streptococcus, Group A  Screen (Direct) NEGATIVE NEGATIVE    Assessment and Plan :   PDMP not reviewed this encounter.  1. Sore throat   2. Left ear pain   3. Allergic rhinitis due to other allergic trigger, unspecified seasonality     Will manage for viral illness such as viral URI, viral syndrome, viral rhinitis, COVID-19, allergic rhinitis. Counseled patient on nature of COVID-19 including modes of transmission, diagnostic testing, management and supportive care.  Offered scripts for symptomatic relief. COVID 19 testing is pending. Counseled patient on potential for adverse effects with medications prescribed/recommended today, ER and return-to-clinic precautions discussed, patient verbalized understanding.     Wallis Bamberg, PA-C 01/14/20 1223

## 2020-01-14 NOTE — ED Triage Notes (Signed)
Pt presents with left ear pain and sore throat x 1 week. Denies other symptoms.

## 2020-01-16 LAB — CULTURE, GROUP A STREP (THRC)

## 2020-02-21 ENCOUNTER — Telehealth: Payer: Self-pay

## 2020-02-21 NOTE — Telephone Encounter (Signed)
Returned call and answered questions about depo

## 2020-03-14 ENCOUNTER — Telehealth: Payer: Self-pay | Admitting: *Deleted

## 2020-03-14 ENCOUNTER — Ambulatory Visit: Payer: Medicaid Other

## 2020-03-14 NOTE — Telephone Encounter (Signed)
Returned call to pt. Pt states she is still having bleeding while on Depo. Pt states she is now having spotting daily. Pt states she may just want to be off BC to see if her body with "regulate itself". Pt made aware that with Depo, may take some time for her body to "normalize".  Advised that if she is sexually active to use protection to prevent unwanted pregnancy.  Pt made aware of depo restart policy if she chooses to start again at a later date.  Advised that she may want to make an appt to discuss other contraceptive methods.   Pt states understanding.

## 2020-04-26 DIAGNOSIS — R519 Headache, unspecified: Secondary | ICD-10-CM | POA: Diagnosis not present

## 2020-04-26 DIAGNOSIS — M545 Low back pain, unspecified: Secondary | ICD-10-CM | POA: Diagnosis not present

## 2020-05-08 DIAGNOSIS — R1084 Generalized abdominal pain: Secondary | ICD-10-CM | POA: Diagnosis not present

## 2020-05-08 DIAGNOSIS — K5900 Constipation, unspecified: Secondary | ICD-10-CM | POA: Diagnosis not present

## 2020-05-08 DIAGNOSIS — N3001 Acute cystitis with hematuria: Secondary | ICD-10-CM | POA: Diagnosis not present

## 2020-05-08 DIAGNOSIS — R1030 Lower abdominal pain, unspecified: Secondary | ICD-10-CM | POA: Diagnosis not present

## 2020-07-12 DIAGNOSIS — Z20822 Contact with and (suspected) exposure to covid-19: Secondary | ICD-10-CM | POA: Diagnosis not present

## 2020-07-12 DIAGNOSIS — R519 Headache, unspecified: Secondary | ICD-10-CM | POA: Diagnosis not present

## 2020-07-12 DIAGNOSIS — K0889 Other specified disorders of teeth and supporting structures: Secondary | ICD-10-CM | POA: Diagnosis not present

## 2020-07-16 ENCOUNTER — Telehealth: Payer: Self-pay

## 2020-07-16 NOTE — Telephone Encounter (Signed)
Transition Care Management Follow-up Telephone Call  Date of discharge and from where: [facility] 07/13/2020  How have you been since you were released from the hospital? Doing well  Any questions or concerns? No  Items Reviewed:  Did the pt receive and understand the discharge instructions provided? Yes   Medications obtained and verified? Yes   Other? No   Any new allergies since your discharge? No   Dietary orders reviewed? Yes  Do you have support at home? Yes   Home Care and Equipment/Supplies: Were home health services ordered? not applicable If so, what is the name of the agency?   Has the agency set up a time to come to the patient's home? not applicable Were any new equipment or medical supplies ordered?  No What is the name of the medical supply agency?  Were you able to get the supplies/equipment? not applicable Do you have any questions related to the use of the equipment or supplies? No  Functional Questionnaire: (I = Independent and D = Dependent) ADLs: I  Bathing/Dressing- I   Meal Prep- I  Eating- I  Maintaining continence- I  Transferring/Ambulation- I  Managing Meds- I  Follow up appointments reviewed:    PCP Hospital f/u appt confirmed? Yes  Scheduled to see dentist tomorrow.  Specialist Hospital f/u appt confirmed? Yes/dentist  Are transportation arrangements needed? No   If their condition worsens, is the pt aware to call PCP or go to the Emergency Dept.? Yes  Was the patient provided with contact information for the PCP's office or ED? Yes  Was to pt encouraged to call back with questions or concerns? Yes

## 2021-04-25 IMAGING — US US PELVIS COMPLETE
1 series · 13 of 25 positions shown · non-contrast
Comparison: 03/07/2019

CLINICAL DATA: Vaginal bleeding and pain with IUD, evaluate
placement

EXAM:
TRANSABDOMINAL AND TRANSVAGINAL ULTRASOUND OF PELVIS
DOPPLER ULTRASOUND OF OVARIES
TECHNIQUE: Both transabdominal and transvaginal ultrasound examinations of the
pelvis were performed. Transabdominal technique was performed for
global imaging of the pelvis including uterus, ovaries, adnexal
regions, and pelvic cul-de-sac.
It was necessary to proceed with endovaginal exam following the
transabdominal exam to visualize the ovaries. Color and duplex
Doppler ultrasound was utilized to evaluate blood flow to the
ovaries.

[Series 1: us pelvis complete · 13 of 93 slices shown]
[im 1/93]
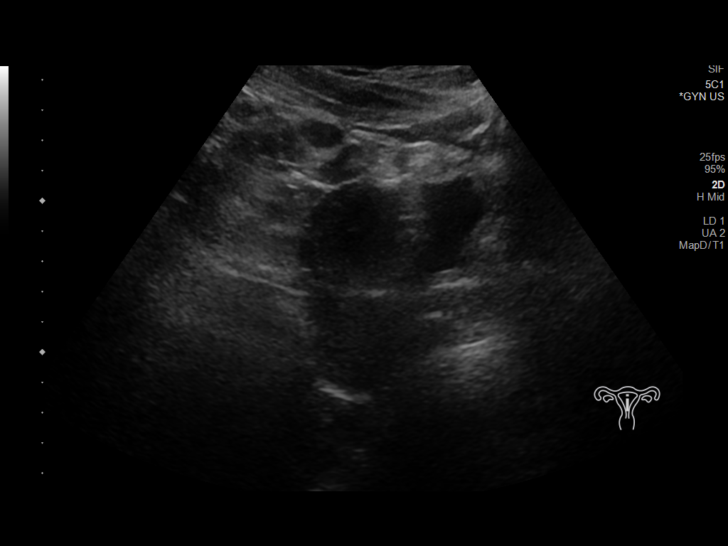
[im 8/93]
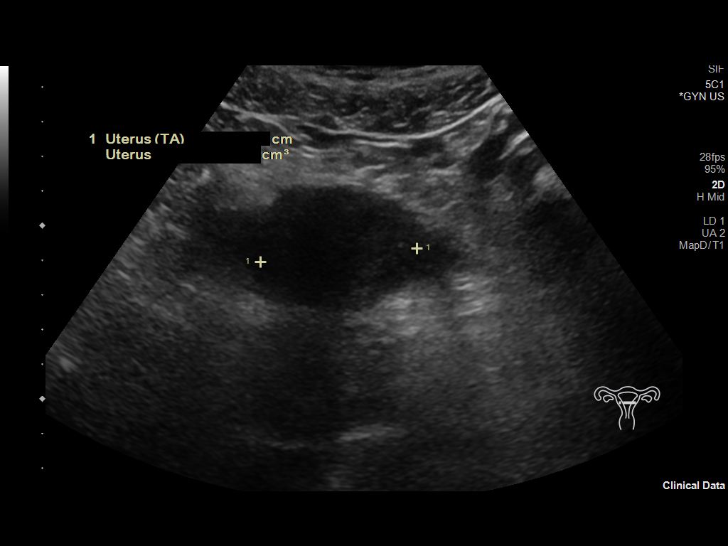
[im 16/93]
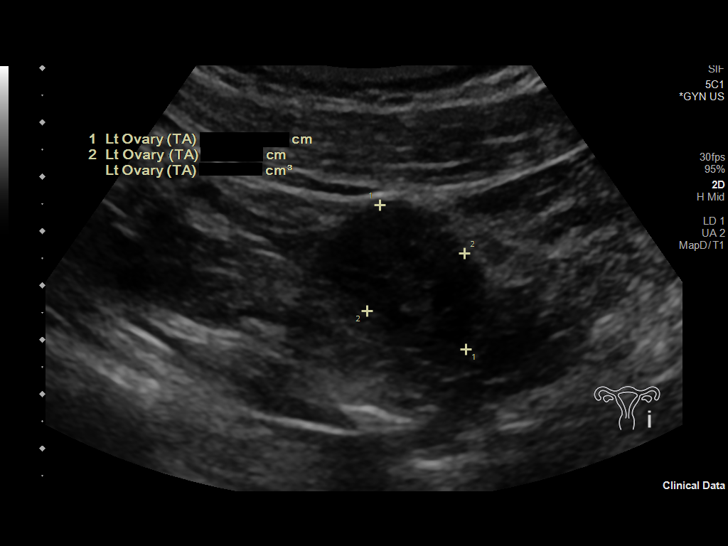
[im 24/93]
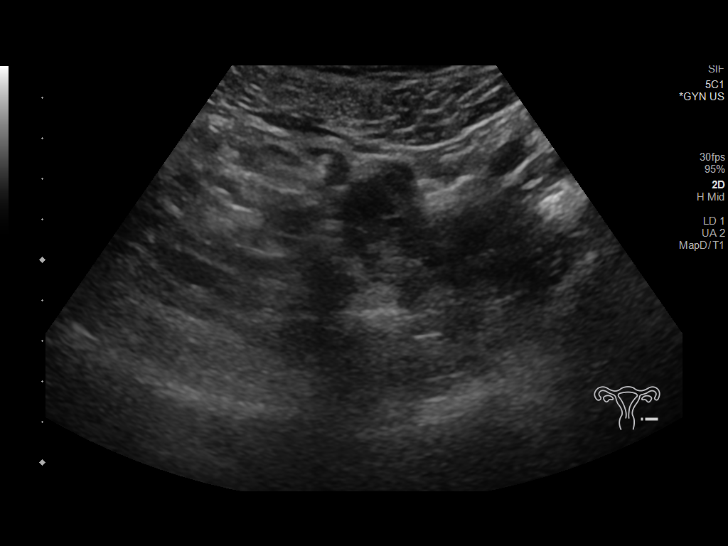
[im 31/93]
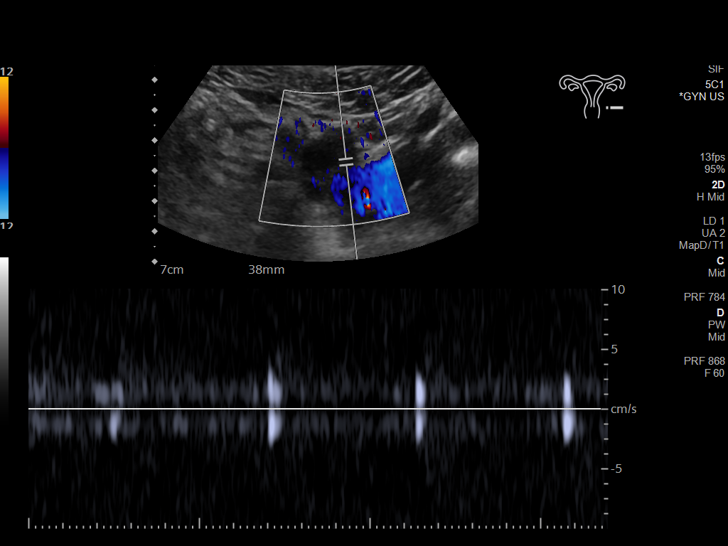
[im 39/93]
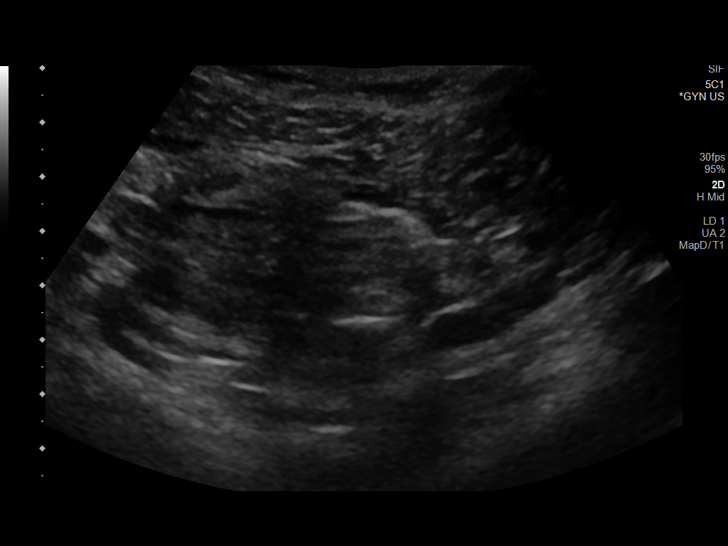
[im 47/93]
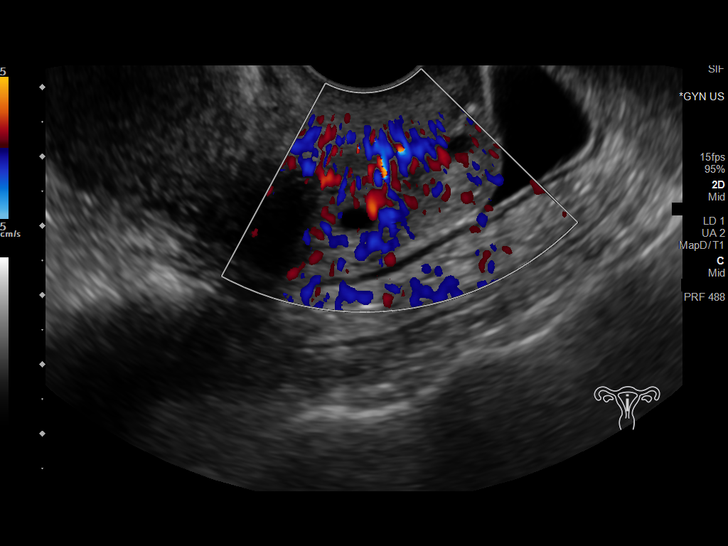
[im 54/93]
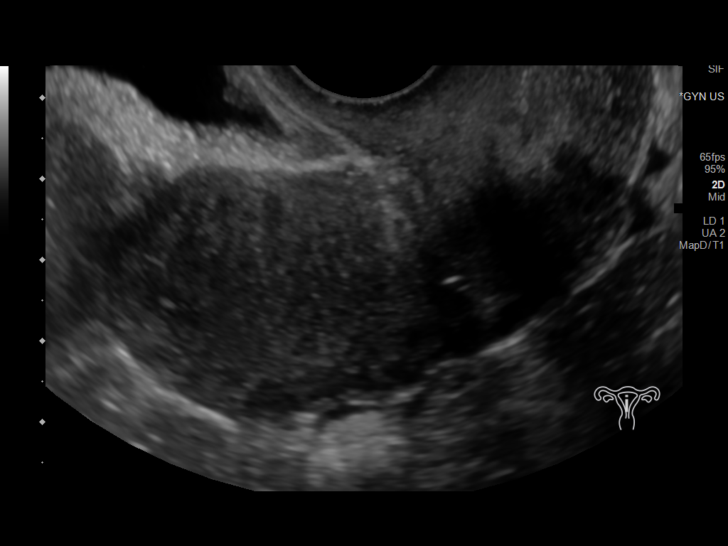
[im 62/93]
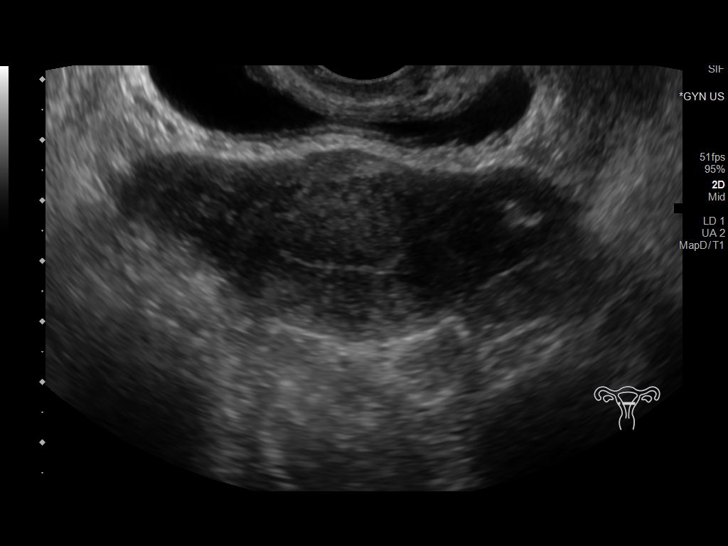
[im 70/93]
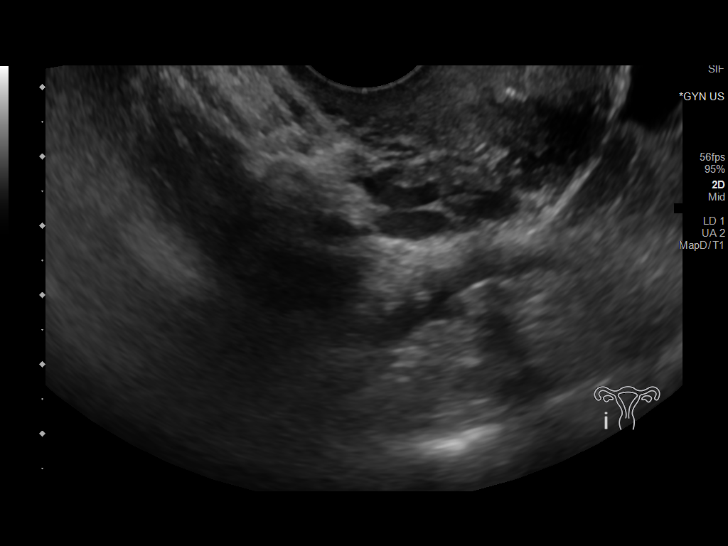
[im 77/93]
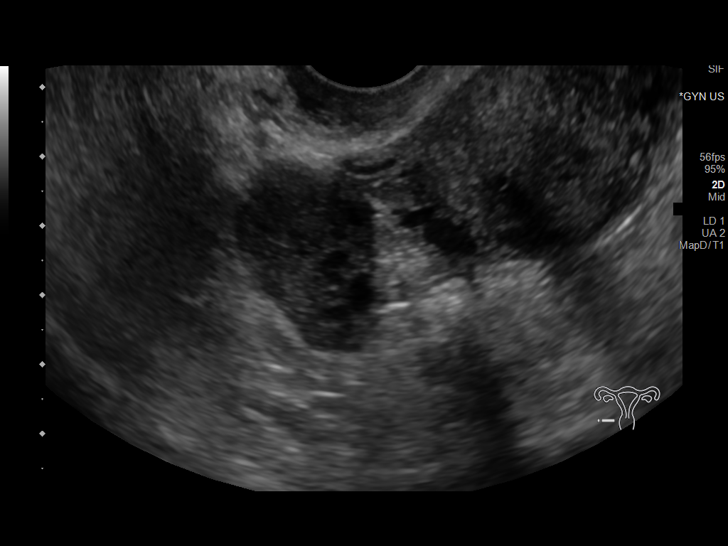
[im 85/93]
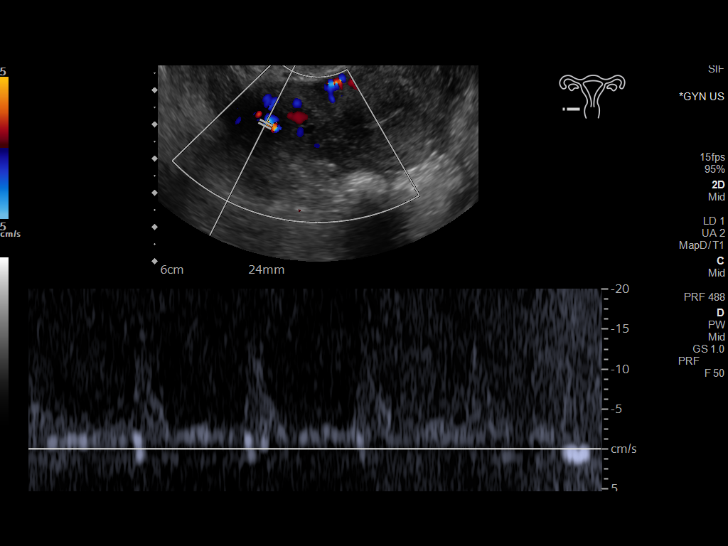
[im 93/93]
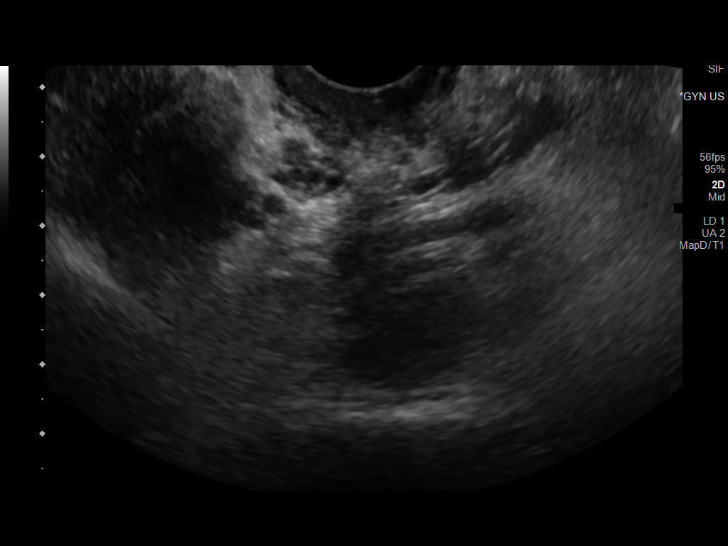

[13 of 25 positions shown; findings below may reference images not displayed]

FINDINGS: Uterus

Measurements: 6.9 x 3.6 x 4.5 cm. = volume: 59 mL. No fibroids or
other mass visualized.

Endometrium

Thickness: 2 mm. IUD is noted in place but appears in the lower
uterine segment and extending partially into the proximal aspect of
the cervix. This is lower than that visualized on the prior
examination.

Right ovary

Measurements: 2.9 x 1.5 x 1.9 cm. = volume: 4.2 mL. Scattered small
follicles are noted.

Left ovary

Measurements: 3.1 x 2.1 x 2.1 cm = volume: 6.9 mL. Normal
appearance/no adnexal mass.

Pulsed Doppler evaluation of both ovaries demonstrates normal
low-resistance arterial and venous waveforms.

Other findings

Small amount of free fluid is noted likely physiologic in nature.
IMPRESSION: IUD is noted in place although lower than that expected which may
correspond with the patient's clinical symptomatology. It is
definitively lower than that noted on the prior exam.

No other focal abnormality is seen.

## 2022-05-18 DIAGNOSIS — J020 Streptococcal pharyngitis: Secondary | ICD-10-CM | POA: Diagnosis not present

## 2022-05-18 DIAGNOSIS — Z20822 Contact with and (suspected) exposure to covid-19: Secondary | ICD-10-CM | POA: Diagnosis not present

## 2022-05-18 DIAGNOSIS — J45909 Unspecified asthma, uncomplicated: Secondary | ICD-10-CM | POA: Diagnosis not present

## 2022-05-18 DIAGNOSIS — J029 Acute pharyngitis, unspecified: Secondary | ICD-10-CM | POA: Diagnosis not present

## 2022-05-18 DIAGNOSIS — R059 Cough, unspecified: Secondary | ICD-10-CM | POA: Diagnosis not present

## 2024-07-15 ENCOUNTER — Encounter (HOSPITAL_BASED_OUTPATIENT_CLINIC_OR_DEPARTMENT_OTHER): Payer: Self-pay | Admitting: Emergency Medicine

## 2024-07-15 ENCOUNTER — Emergency Department (HOSPITAL_BASED_OUTPATIENT_CLINIC_OR_DEPARTMENT_OTHER)
Admission: EM | Admit: 2024-07-15 | Discharge: 2024-07-15 | Disposition: A | Discharge status: Home / Self Care | Payer: Self-pay | Attending: Emergency Medicine | Admitting: Emergency Medicine

## 2024-07-15 ENCOUNTER — Other Ambulatory Visit: Payer: Self-pay

## 2024-07-15 ENCOUNTER — Emergency Department (HOSPITAL_BASED_OUTPATIENT_CLINIC_OR_DEPARTMENT_OTHER): Payer: Self-pay

## 2024-07-15 DIAGNOSIS — R519 Headache, unspecified: Secondary | ICD-10-CM | POA: Insufficient documentation

## 2024-07-15 DIAGNOSIS — R030 Elevated blood-pressure reading, without diagnosis of hypertension: Secondary | ICD-10-CM | POA: Insufficient documentation

## 2024-07-15 LAB — CBC WITH DIFFERENTIAL/PLATELET
Abs Immature Granulocytes: 0.01 K/uL (ref 0.00–0.07)
Basophils Absolute: 0 K/uL (ref 0.0–0.1)
Basophils Relative: 0 %
Eosinophils Absolute: 0.2 K/uL (ref 0.0–0.5)
Eosinophils Relative: 2 %
HCT: 39.1 % (ref 36.0–46.0)
Hemoglobin: 13.4 g/dL (ref 12.0–15.0)
Immature Granulocytes: 0 %
Lymphocytes Relative: 26 %
Lymphs Abs: 2.1 K/uL (ref 0.7–4.0)
MCH: 30.5 pg (ref 26.0–34.0)
MCHC: 34.3 g/dL (ref 30.0–36.0)
MCV: 88.9 fL (ref 80.0–100.0)
Monocytes Absolute: 0.6 K/uL (ref 0.1–1.0)
Monocytes Relative: 7 %
Neutro Abs: 5 K/uL (ref 1.7–7.7)
Neutrophils Relative %: 65 %
Platelets: 205 K/uL (ref 150–400)
RBC: 4.4 MIL/uL (ref 3.87–5.11)
RDW: 13 % (ref 11.5–15.5)
WBC: 7.8 K/uL (ref 4.0–10.5)
nRBC: 0 % (ref 0.0–0.2)

## 2024-07-15 LAB — COMPREHENSIVE METABOLIC PANEL WITH GFR
ALT: 9 U/L (ref 0–44)
AST: 16 U/L (ref 15–41)
Albumin: 4.2 g/dL (ref 3.5–5.0)
Alkaline Phosphatase: 41 U/L (ref 38–126)
Anion gap: 11 (ref 5–15)
BUN: 15 mg/dL (ref 6–20)
CO2: 21 mmol/L — ABNORMAL LOW (ref 22–32)
Calcium: 8.9 mg/dL (ref 8.9–10.3)
Chloride: 106 mmol/L (ref 98–111)
Creatinine, Ser: 0.85 mg/dL (ref 0.44–1.00)
GFR, Estimated: >60 mL/min
Glucose, Bld: 83 mg/dL (ref 70–99)
Potassium: 4.1 mmol/L (ref 3.5–5.1)
Sodium: 137 mmol/L (ref 135–145)
Total Bilirubin: 0.4 mg/dL (ref 0.0–1.2)
Total Protein: 7.7 g/dL (ref 6.5–8.1)

## 2024-07-15 LAB — PREGNANCY, URINE: Preg Test, Ur: NEGATIVE

## 2024-07-15 LAB — URINALYSIS, ROUTINE W REFLEX MICROSCOPIC
Bilirubin Urine: NEGATIVE
Glucose, UA: NEGATIVE mg/dL
Ketones, ur: NEGATIVE mg/dL
Nitrite: NEGATIVE
Protein, ur: NEGATIVE mg/dL
Specific Gravity, Urine: 1.01 (ref 1.005–1.030)
pH: 5.5 (ref 5.0–8.0)

## 2024-07-15 LAB — URINALYSIS, MICROSCOPIC (REFLEX)

## 2024-07-15 LAB — LIPASE, BLOOD: Lipase: 27 U/L (ref 11–51)

## 2024-07-15 MED ORDER — SODIUM CHLORIDE 0.9 % IV BOLUS
1000.0000 mL | Freq: Once | INTRAVENOUS | Status: AC
  Administered 2024-07-15: 1000 mL via INTRAVENOUS

## 2024-07-15 MED ORDER — PROCHLORPERAZINE EDISYLATE 10 MG/2ML IJ SOLN
10.0000 mg | Freq: Once | INTRAMUSCULAR | Status: AC
  Administered 2024-07-15: 10 mg via INTRAVENOUS
  Filled 2024-07-15: qty 2

## 2024-07-15 MED ORDER — DIPHENHYDRAMINE HCL 50 MG/ML IJ SOLN
12.5000 mg | Freq: Once | INTRAMUSCULAR | Status: AC
  Administered 2024-07-15: 12.5 mg via INTRAVENOUS
  Filled 2024-07-15: qty 1

## 2024-07-15 NOTE — ED Provider Notes (Signed)
 " [place] [facility] Provider Note   CSN: [id number] Arrival date & time: 07/15/24  1205    Patient presents with: Headache   Carla Hammond is a 29 y.o. female here for evaluation of headache.  Started yesterday.  No sudden onset thunderclap headache-- gradual onset.  Got TdVax yesterday however had headache prior.  Associated episode of NBNB emesis today.  No light sensitivity, neck pain, congestion, rhinorrhea, sore throat, chest pain, shortness of breath, abdominal pain.  No numbness or weakness.  No dizziness.  No history of similar.  She took her blood pressures at home starting yesterday which were elevated.  No history of hypertension.  No history of IIH.   HPI     Prior to Admission medications  Medication Sig Start Date End Date Taking? Authorizing Provider  albuterol (PROVENTIL HFA;VENTOLIN HFA) 108 (90 Base) MCG/ACT inhaler Inhale 2 puffs into the lungs every 6 (six) hours as needed for wheezing or shortness of breath.    [provider]  cetirizine (ZYRTEC) 10 MG tablet Take 10 mg by mouth daily.    [provider]  fluticasone  (FLONASE ) 50 MCG/ACT nasal spray Place 2 sprays into both nostrils daily. 01/14/20   Christopher Savannah, PA-C  levonorgestrel  (MIRENA ) 20 MCG/24HR IUD 1 each by Intrauterine route once.    [provider]  medroxyPROGESTERone  (DEPO-PROVERA ) 150 MG/ML injection Inject 1 mL (150 mg total) into the muscle every 3 (three) months. 09/20/19   Sparacino, Hailey L, DO  naproxen  (NAPROSYN ) 500 MG tablet Take 1 tablet (500 mg total) by mouth 2 (two) times daily with a meal. 01/14/20   Christopher Savannah, PA-C  pseudoephedrine  (SUDAFED) 60 MG tablet Take 1 tablet (60 mg total) by mouth every 8 (eight) hours as needed for congestion. 01/14/20   Christopher Savannah, PA-C  carvedilol  (COREG ) 12.5 MG tablet Take 1 tablet (12.5 mg total) by mouth 2 (two) times daily with a meal. Patient not taking: Reported on 02/11/2019 08/11/18  05/15/19  Rudy Carlin LABOR, MD  triamterene -hydrochlorothiazide  (MAXZIDE -25) 37.5-25 MG tablet Take 1 tablet by mouth daily. Patient not taking: Reported on 02/11/2019 08/10/18 05/15/19  Loreli Suzen BIRCH, CNM    Allergies: Chocolate    Review of Systems  Constitutional: Negative.   HENT: Negative.    Respiratory: Negative.    Cardiovascular: Negative.   Gastrointestinal:  Positive for nausea and vomiting. Negative for abdominal distention, abdominal pain, anal bleeding, blood in stool, constipation, diarrhea and rectal pain.  Genitourinary: Negative.   Musculoskeletal: Negative.   Skin: Negative.   Neurological:  Positive for headaches. Negative for dizziness, seizures, syncope, facial asymmetry, speech difficulty, weakness, light-headedness and numbness.  All other systems reviewed and are negative.   Updated Vital Signs BP 131/86   Pulse 63   Temp 98.6 F (37 C) (Oral)   Resp 16   Ht 5' 3 (1.6 m)   Wt 110.7 kg   LMP 06/29/2024 (Approximate)   SpO2 100%   BMI 43.23 kg/m   Physical Exam Physical Exam  Constitutional: Pt is oriented to person, place, and time. Pt appears well-developed and well-nourished. No distress.  HENT:  Head: Normocephalic and atraumatic.  Mouth/Throat: Oropharynx is clear and moist.  Eyes: Conjunctivae and EOM are normal. Pupils are equal, round, and reactive to light. No scleral icterus.  No horizontal, vertical or rotational nystagmus  Neck: Normal range of motion. Neck supple.  Full active and passive ROM without pain No midline or paraspinal tenderness No nuchal  rigidity or meningeal signs  Cardiovascular: Normal rate, regular rhythm and intact distal pulses.   Pulmonary/Chest: Effort normal and breath sounds normal. No respiratory distress. Pt has no wheezes. No rales.  Abdominal: Soft. Bowel sounds are normal. There is no tenderness. There is no rebound and no guarding.  Musculoskeletal: Normal range of motion.  Lymphadenopathy:    No  cervical adenopathy.  Neurological: Pt. is alert and oriented to person, place, and time. He has normal reflexes. No cranial nerve deficit.  Exhibits normal muscle tone. Coordination normal.  Mental Status:  Alert, oriented, thought content appropriate. Speech fluent without evidence of aphasia. Able to follow 2 step commands without difficulty.  Cranial Nerves:  II:  Peripheral visual fields grossly normal, pupils equal, round, reactive to light 2-12 grossly intact Motor:  5/5 strength BIL Sensory: Intact sensation bilaterally Cerebellar: normal F2N Gait: normal gait and balance CV: distal pulses palpable throughout   Skin: Skin is warm and dry. No rash noted. Pt is not diaphoretic.  Psychiatric: Pt has a normal mood and affect. Behavior is normal. Judgment and thought content normal.  Nursing note and vitals reviewed.  (all labs ordered are listed, but only abnormal results are displayed) Labs Reviewed  URINALYSIS, ROUTINE W REFLEX MICROSCOPIC - Abnormal; Notable for the following components:      Result Value   Hgb urine dipstick MODERATE (*)    Leukocytes,Ua MODERATE (*)    All other components within normal limits  URINALYSIS, MICROSCOPIC (REFLEX) - Abnormal; Notable for the following components:   Bacteria, UA FEW (*)    All other components within normal limits  COMPREHENSIVE METABOLIC PANEL WITH GFR - Abnormal; Notable for the following components:   CO2 21 (*)    All other components within normal limits  PREGNANCY, URINE  CBC WITH DIFFERENTIAL/PLATELET  LIPASE, BLOOD    EKG: None  Radiology: CT Head Wo Contrast Result Date: 07/15/2024 EXAM: CT HEAD WITHOUT CONTRAST 07/15/2024 02:57:00 PM TECHNIQUE: CT of the head was performed without the administration of intravenous contrast. Automated exposure control, iterative reconstruction, and/or weight based adjustment of the mA/kV was utilized to reduce the radiation dose to as low as reasonably achievable. COMPARISON: None  available. CLINICAL HISTORY: Headache, increasing frequency or severity. FINDINGS: BRAIN AND VENTRICLES: No acute hemorrhage. No evidence of acute infarct. No hydrocephalus. No extra-axial collection. No mass effect or midline shift. ORBITS: No acute abnormality. SINUSES: No acute abnormality. SOFT TISSUES AND SKULL: Fluid in right mastoid air cells and right middle ear cavity. Additional small left mastoid effusion. No acute soft tissue abnormality. No skull fracture. IMPRESSION: 1. No acute intracranial abnormality. 2. Right otomastoid effusion. Additional small left mastoid effusion. Electronically signed by: Donnice Mania MD 07/15/2024 03:33 PM EST RP Workstation: [id number]     Procedures   Medications Ordered in the ED  prochlorperazine (COMPAZINE) injection 10 mg (10 mg Intravenous Given 07/15/24 1437)  diphenhydrAMINE  (BENADRYL ) injection 12.5 mg (12.5 mg Intravenous Given 07/15/24 1437)  sodium chloride  0.9 % bolus 1,000 mL (0 mLs Intravenous Stopped 07/15/24 6624)   29 year old here for evaluation of headache and hypertension.  No history of similar.  Gradual onset yesterday.  Has been checking her blood pressures at home which have been elevated.  No history of hypertension.  Associated single episode of NBNB emesis.  No vision changes, numbness, weakness, slurred speech, dizziness.  No URI symptoms.  Heart and lungs clear.  Abdomen soft, nontender.  She has a nonfocal neuroexam.  Shared decision making with patient.  Blood pressure improving here.  Will plan on labs, CT imaging.  She denies any sudden onset headache, vision changes, no history of IIH.  Labs and imaging personally viewed and interpreted:  UA moderate leuks, nitrite negative, bacteria negative Pregnancy test neg CT head without significant abnormality CBC without leukocytosis, hemoglobin 13 CMP without significant abnormality Lipase 27  Patient reassessed.  Headache resolved.  Tolerating p.o. intake.  Blood pressure trending  down nicely.  Low suspicion for hypertensive urgency, emergency.  History and exam not consistent with IIH, meningitis,, bleed, trigeminal neuralgia, giant cell arteritis, dural thrombosis, pres, sah, CVA, dissection.  Headache resolved with medications here.  I discussed keeping log of blood pressures at home, establishing care with her primary care provider, return for any worsening symptoms.  Patient agreeable.  The patient has been appropriately medically screened and/or stabilized in the ED. I have low suspicion for any other emergent medical condition which would require further screening, evaluation or treatment in the ED or require inpatient management.  Patient is hemodynamically stable and in no acute distress.  Patient able to ambulate in department prior to ED.  Evaluation does not show acute pathology that would require ongoing or additional emergent interventions while in the emergency department or further inpatient treatment.  I have discussed the diagnosis with the patient and answered all questions.  Pain is been managed while in the emergency department and patient has no further complaints prior to discharge.  Patient is comfortable with plan discussed in room and is stable for discharge at this time.  I have discussed strict return precautions for returning to the emergency department.  Patient was encouraged to follow-up with PCP/specialist refer to at discharge.                                    Medical Decision Making Amount and/or Complexity of Data Reviewed External Data Reviewed: labs, radiology and notes. Labs: ordered. Decision-making details documented in ED Course. Radiology: ordered and independent interpretation performed. Decision-making details documented in ED Course.  Risk OTC drugs. Prescription drug management. Decision regarding hospitalization. Diagnosis or treatment significantly limited by social determinants of health.        Final diagnoses:   Acute nonintractable headache, unspecified headache type  Elevated blood pressure reading    ED Discharge Orders     None          Rishaan Gunner A, PA-C 07/15/24 2154    Dreama Longs, MD 07/15/24 2247  "

## 2024-07-15 NOTE — ED Triage Notes (Signed)
 Headache x 1 day , vomited today . No light sensitivity or dizziness .  No URI symptoms .  Concerned for hypertension , no HX of it .  Alert and oriented x 4 , ambulatory with steady gait .

## 2024-07-15 NOTE — ED Notes (Signed)
 Patient transferred from waiting room to ED treatment room. Assuming pt care at this time.

## 2024-07-15 NOTE — Discharge Instructions (Signed)
 It was a pleasure taking care of you here today.  As we discussed you do have a little bit of fluid on your CT scan in your sinuses on your right.  I would recommend taking Zyrtec or Claritin over the next week or 2 as well as using Flonase  twice daily.  Your initial blood pressure was elevated here which has trended down.  I did make sure to keep a log of your blood pressures and establish care with a primary care provider  Return for any new or worsening symptoms such as severe sudden onset headache, vision changes, slurred speech, numbness, weakness to one-sided your body, chest pain or shortness of breath
